# Patient Record
Sex: Male | Born: 1970 | Race: White | Hispanic: No | Marital: Married | Smoking: Never smoker
Health system: Southern US, Community
[De-identification: ages and names within clinical notes are randomized; demographics above are authoritative.]

## PROBLEM LIST (undated history)

## (undated) DIAGNOSIS — K219 Gastro-esophageal reflux disease without esophagitis: Secondary | ICD-10-CM

## (undated) DIAGNOSIS — M1990 Unspecified osteoarthritis, unspecified site: Secondary | ICD-10-CM

## (undated) DIAGNOSIS — T7840XA Allergy, unspecified, initial encounter: Secondary | ICD-10-CM

## (undated) DIAGNOSIS — I10 Essential (primary) hypertension: Secondary | ICD-10-CM

## (undated) HISTORY — DX: Gastro-esophageal reflux disease without esophagitis: K21.9

## (undated) HISTORY — DX: Essential (primary) hypertension: I10

## (undated) HISTORY — DX: Unspecified osteoarthritis, unspecified site: M19.90

## (undated) HISTORY — PX: KNEE SURGERY: SHX244

## (undated) HISTORY — PX: HERNIA REPAIR: SHX51

## (undated) HISTORY — DX: Allergy, unspecified, initial encounter: T78.40XA

---

## 2002-10-13 ENCOUNTER — Ambulatory Visit (HOSPITAL_BASED_OUTPATIENT_CLINIC_OR_DEPARTMENT_OTHER): Admission: RE | Admit: 2002-10-13 | Discharge: 2002-10-13 | Payer: Self-pay | Admitting: Orthopaedic Surgery

## 2007-01-17 ENCOUNTER — Ambulatory Visit (HOSPITAL_BASED_OUTPATIENT_CLINIC_OR_DEPARTMENT_OTHER): Admission: RE | Admit: 2007-01-17 | Discharge: 2007-01-17 | Payer: Self-pay | Admitting: Urology

## 2016-07-09 DIAGNOSIS — Z0000 Encounter for general adult medical examination without abnormal findings: Secondary | ICD-10-CM | POA: Diagnosis not present

## 2016-07-09 DIAGNOSIS — Z6827 Body mass index (BMI) 27.0-27.9, adult: Secondary | ICD-10-CM | POA: Diagnosis not present

## 2016-07-09 DIAGNOSIS — Z1389 Encounter for screening for other disorder: Secondary | ICD-10-CM | POA: Diagnosis not present

## 2016-07-09 LAB — CBC AND DIFFERENTIAL
HCT: 41 (ref 41–53)
HEMATOCRIT: 42 (ref 41–53)
HEMOGLOBIN: 14.1 (ref 13.5–17.5)
Hemoglobin: 14.8 (ref 13.5–17.5)
NEUTROS ABS: 5
NEUTROS ABS: 5
PLATELETS: 284 (ref 150–399)
Platelets: 356 (ref 150–399)
WBC: 7
WBC: 7

## 2016-07-09 LAB — LIPID PANEL
CHOLESTEROL: 192 (ref 0–200)
Cholesterol: 214 — AB (ref 0–200)
HDL: 60 (ref 35–70)
HDL: 62 (ref 35–70)
LDL CALC: 117
LDL CALC: 138
TRIGLYCERIDES: 77 (ref 40–160)
Triglycerides: 68 (ref 40–160)

## 2016-07-09 LAB — BASIC METABOLIC PANEL
BUN: 14 (ref 4–21)
BUN: 14 (ref 4–21)
CREATININE: 1 (ref 0.6–1.3)
Creatinine: 1 (ref 0.6–1.3)
GLUCOSE: 82
Glucose: 95
Potassium: 4.3 (ref 3.4–5.3)
Potassium: 4.4 (ref 3.4–5.3)
SODIUM: 141 (ref 137–147)
Sodium: 140 (ref 137–147)

## 2016-07-09 LAB — HEPATIC FUNCTION PANEL
ALK PHOS: 56 (ref 25–125)
ALT: 20 (ref 10–40)
ALT: 22 (ref 10–40)
AST: 18 (ref 14–40)
AST: 24 (ref 14–40)
Alkaline Phosphatase: 68 (ref 25–125)
Bilirubin, Total: 0.5
Bilirubin, Total: 0.8

## 2016-07-09 LAB — TSH
TSH: 1.26 (ref 0.41–5.90)
TSH: 1.43 (ref 0.41–5.90)

## 2017-07-16 DIAGNOSIS — Z1389 Encounter for screening for other disorder: Secondary | ICD-10-CM | POA: Diagnosis not present

## 2017-07-16 DIAGNOSIS — R030 Elevated blood-pressure reading, without diagnosis of hypertension: Secondary | ICD-10-CM | POA: Diagnosis not present

## 2017-07-16 DIAGNOSIS — Z6827 Body mass index (BMI) 27.0-27.9, adult: Secondary | ICD-10-CM | POA: Diagnosis not present

## 2017-07-16 DIAGNOSIS — Z0000 Encounter for general adult medical examination without abnormal findings: Secondary | ICD-10-CM | POA: Diagnosis not present

## 2017-07-16 LAB — BASIC METABOLIC PANEL
BUN: 12 (ref 4–21)
Creatinine: 1 (ref 0.6–1.3)
Glucose: 92
Potassium: 4.6 (ref 3.4–5.3)
Sodium: 141 (ref 137–147)

## 2017-07-16 LAB — HEPATIC FUNCTION PANEL
ALK PHOS: 58 (ref 25–125)
ALT: 24 (ref 10–40)
AST: 21 (ref 14–40)
Bilirubin, Total: 0.6

## 2017-07-16 LAB — CBC AND DIFFERENTIAL
HEMATOCRIT: 43 (ref 41–53)
HEMOGLOBIN: 14.4 (ref 13.5–17.5)
Neutrophils Absolute: 4
PLATELETS: 321 (ref 150–399)
WBC: 5.7

## 2017-07-16 LAB — LIPID PANEL
CHOLESTEROL: 173 (ref 0–200)
HDL: 62 (ref 35–70)
LDL Cholesterol: 99
TRIGLYCERIDES: 61 (ref 40–160)

## 2017-07-16 LAB — TSH: TSH: 1.55 (ref 0.41–5.90)

## 2018-03-10 ENCOUNTER — Ambulatory Visit: Payer: Self-pay | Admitting: Family Medicine

## 2018-03-25 DIAGNOSIS — L308 Other specified dermatitis: Secondary | ICD-10-CM | POA: Diagnosis not present

## 2018-03-25 DIAGNOSIS — L731 Pseudofolliculitis barbae: Secondary | ICD-10-CM | POA: Diagnosis not present

## 2018-03-25 DIAGNOSIS — C44311 Basal cell carcinoma of skin of nose: Secondary | ICD-10-CM | POA: Diagnosis not present

## 2018-03-25 DIAGNOSIS — L0100 Impetigo, unspecified: Secondary | ICD-10-CM | POA: Diagnosis not present

## 2018-03-25 DIAGNOSIS — L0889 Other specified local infections of the skin and subcutaneous tissue: Secondary | ICD-10-CM | POA: Diagnosis not present

## 2018-04-24 ENCOUNTER — Ambulatory Visit: Payer: BLUE CROSS/BLUE SHIELD | Admitting: Family Medicine

## 2018-04-24 ENCOUNTER — Encounter: Payer: Self-pay | Admitting: Family Medicine

## 2018-04-24 VITALS — BP 138/90 | HR 86 | Ht 69.5 in | Wt 204.0 lb

## 2018-04-24 DIAGNOSIS — E663 Overweight: Secondary | ICD-10-CM | POA: Diagnosis not present

## 2018-04-24 DIAGNOSIS — R030 Elevated blood-pressure reading, without diagnosis of hypertension: Secondary | ICD-10-CM | POA: Diagnosis not present

## 2018-04-24 DIAGNOSIS — Z8249 Family history of ischemic heart disease and other diseases of the circulatory system: Secondary | ICD-10-CM | POA: Diagnosis not present

## 2018-04-24 DIAGNOSIS — I10 Essential (primary) hypertension: Secondary | ICD-10-CM | POA: Insufficient documentation

## 2018-04-24 NOTE — Patient Instructions (Signed)

## 2018-04-24 NOTE — Progress Notes (Signed)
New patient office visit note:  Impression and Recommendations:    1. Elevated blood pressure reading without diagnosis of hypertension   2. (BMI 25.0-29.9)   3. FHx: early coronary artery disease-   father early 6650s first heart attack     1. Elevated BP reading without diagnosis -will continue to monitor.  2. BMI 25-29 -recommend losing weight.  -AHA dietary and exercise guidelines discussed. -encouraged to eat regular, small meals and snacks throughout the day.  3. FHx early CAD -will monitor closely.   -check fasting labs in near future.    Education and routine counseling performed. Handouts provided.  Gross side effects, risk and benefits, and alternatives of medications discussed with patient.  Patient is aware that all medications have potential side effects and we are unable to predict every side effect or drug-drug interaction that may occur.  Expresses verbal understanding and consents to current therapy plan and treatment regimen.  Return for CPE/ yrly physical, come fasting-when you are due over the next several months.;  And as needed.  Please see AVS handed out to patient at the end of our visit for further patient instructions/ counseling done pertaining to today's office visit.    Note: This document was prepared using Dragon voice recognition software and may include unintentional dictation errors.  This document serves as a record of services personally performed by Randall Loteborah Krystopher Kuenzel, DO. It was created on her behalf by Randall White, a trained medical scribe. The creation of this record is based on the scribe's personal observations and the provider's statements to them.   I have reviewed the above medical documentation for accuracy and completeness and I concur.  Randall White 04/27/18 10:11 PM   ----------------------------------------------------------------------------------------------------------------------    Subjective:    Chief  complaint:   Chief Complaint  Patient presents with  . Establish Care     HPI: Randall White is a pleasant 47 y.o. male who [facility] today to review their medical history with me and establish care.   I asked the patient to review their chronic problem list with me to ensure everything was updated and accurate.    All recent office visits with other providers, any medical records that patient brought in etc  - I reviewed today.     We asked pt to get us their medical records from [facility] providers/ specialists that they had seen within the past 3-5 years- if they are in private practice and/or do not work for [facility], [facility], [facility], [facility] or [facility] owned practice.  Told them to call their specialists to clarify this if they are not sure.   Personal information Married to wife, Randall LaundrySonya. 1 son.  He has 1 dog, 2 horses, 1 donkey.  1 estranged sister- unknown health history.  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 He has worked for the same company, [employer] for 25 years.   Little to no diet/exercise- too busy from working often. Feeling decreased energy recently. Drinks diet coke. Does not eat lunch on occasion due to work schedule.   Diet consists of little vegetables- does not like salads/tomatoes, etc.   Wants to lose some weight and regain some energy.   Other providers [place] in [place] [facility] - he only saw for CPE yearly.    PMHx History reviewed. No pertinent past medical history. None to his knowledge.  Seasonal allergies- takes  zyrtec  Takes advil (800 mg per week) for aches in back.  FMHx Family History  Problem Relation Age of Onset  . Heart attack Father   . Leukemia Maternal Grandmother   . Diabetes Paternal Grandmother   Maternal grandmother- early 18s leukemia diagnosis. Father - MI age 57  PSHx Past Surgical History:  Procedure Laterality Date  . HERNIA REPAIR      . KNEE SURGERY    L knee surgery Inguinal hernia repair  SHx No smoke Uses snuff: 1 can per day, 30 years. Goes to dentist.  Drinks 3-4 beers on some nights. No heavy binges.  Only monogamous with his wife.  Drinks 1-2 cups of coffee a day and a lot of water throughout the day.    Social History   Tobacco Use  . Smoking status: Never Smoker  . Smokeless tobacco: Current User    Types: Snuff  Substance Use Topics  . Alcohol use: Yes  . Drug use: Never      Wt Readings from Last 3 Encounters:  04/24/18 204 lb (92.5 kg)   BP Readings from Last 3 Encounters:  04/24/18 138/90   Pulse Readings from Last 3 Encounters:  04/24/18 86   BMI Readings from Last 3 Encounters:  04/24/18 29.69 kg/m    Patient Care Team    Relationship Specialty Notifications Start End  Randall Lot, DO PCP - General Family Medicine  04/24/18   Valeria Batman, MD Consulting Physician Orthopedic Surgery  04/24/18   Arminda Resides, MD Consulting Physician Dermatology  04/24/18   Albin Felling, OD  Optometry  04/24/18     Patient Active Problem List   Diagnosis Date Noted  . Overweight (BMI 25.0-29.9) 04/24/2018  . FHx: early coronary artery disease-   father early 59s first heart attack 04/24/2018  . Elevated blood pressure reading without diagnosis of hypertension 04/24/2018     History reviewed. No pertinent past medical history.   History reviewed. No pertinent past medical history.   Past Surgical History:  Procedure Laterality Date  . HERNIA REPAIR    . KNEE SURGERY       Family History  Problem Relation Age of Onset  . Heart attack Father   . Leukemia Maternal Grandmother   . Diabetes Paternal Grandmother      Social History   Substance and Sexual Activity  Drug Use Never     Social History   Substance and Sexual Activity  Alcohol Use Yes     Social History   Tobacco Use  Smoking Status Never Smoker  Smokeless Tobacco Current User  . Types: Snuff      Current Meds  Medication Sig  . cetirizine (ZYRTEC) 10 MG tablet Take 10 mg by mouth daily.  Marland Kitchen ibuprofen (ADVIL,MOTRIN) 200 MG tablet Take 200 mg by mouth every 6 (six) hours as needed.    Allergies: Patient has no allergy information on record.   Review of Systems  Constitutional: Negative for chills, diaphoresis, fever, malaise/fatigue and weight loss.  HENT: Negative for congestion, sore throat and tinnitus.   Eyes: Negative for blurred vision, double vision and photophobia.  Respiratory: Negative for cough and wheezing.   Cardiovascular: Negative for chest pain and palpitations.  Gastrointestinal: Negative for blood in stool, diarrhea, nausea and vomiting.  Genitourinary: Negative for dysuria, frequency and urgency.  Musculoskeletal: Negative for joint pain and myalgias.  Skin: Negative for itching and rash.  Neurological: Negative for dizziness, focal weakness, weakness and headaches.  Endo/Heme/Allergies:  Negative for environmental allergies and polydipsia. Does not bruise/bleed easily.  Psychiatric/Behavioral: Negative for depression and memory loss. The patient is not nervous/anxious and does not have insomnia.      Objective:   Blood pressure 138/90, pulse 86, height 5' 9.5" (1.765 m), weight 204 lb (92.5 kg), SpO2 98 %. Body mass index is 29.69 kg/m. General: Well Developed, well nourished, and in no acute distress.  Neuro: Alert and oriented x3, extra-ocular muscles intact, sensation grossly intact.  HEENT:[place]/AT, PERRLA, neck supple, No carotid bruits Skin: no gross rashes  Cardiac: Regular rate and rhythm Respiratory: Essentially clear to auscultation bilaterally. Not using accessory muscles, speaking in full sentences.  Abdominal: not grossly distended Musculoskeletal: Ambulates w/o diff, FROM * 4 ext.  Vasc: less 2 sec cap RF, warm and pink  Psych:  No HI/SI, judgement and insight good, Euthymic mood. Full Affect.    No results found for this or any  previous visit (from the past 2160 hour(s)).

## 2018-05-01 DIAGNOSIS — C44311 Basal cell carcinoma of skin of nose: Secondary | ICD-10-CM | POA: Diagnosis not present

## 2018-07-01 ENCOUNTER — Telehealth: Payer: Self-pay | Admitting: Family Medicine

## 2018-07-01 ENCOUNTER — Other Ambulatory Visit: Payer: Self-pay

## 2018-07-01 DIAGNOSIS — E663 Overweight: Secondary | ICD-10-CM

## 2018-07-01 DIAGNOSIS — Z8249 Family history of ischemic heart disease and other diseases of the circulatory system: Secondary | ICD-10-CM

## 2018-07-01 DIAGNOSIS — R030 Elevated blood-pressure reading, without diagnosis of hypertension: Secondary | ICD-10-CM

## 2018-07-01 NOTE — Telephone Encounter (Signed)
Orders for labs have been placed for patient. MPulliam, CMA/RT(R)

## 2018-07-01 NOTE — Telephone Encounter (Signed)
Patient's wife called states he needed these (2) thing for his employer ins discount --FBW & CPE--- just wanted medical assistant to know there are no lab order ( For scheduled / upcoming  Lab appt on 8/22.)  --Forwarding message to medical assistant.  --Fausto Skillernglh

## 2018-07-10 ENCOUNTER — Other Ambulatory Visit: Payer: BLUE CROSS/BLUE SHIELD

## 2018-07-10 ENCOUNTER — Encounter: Payer: Self-pay | Admitting: Family Medicine

## 2018-07-10 ENCOUNTER — Ambulatory Visit (INDEPENDENT_AMBULATORY_CARE_PROVIDER_SITE_OTHER): Payer: BLUE CROSS/BLUE SHIELD | Admitting: Family Medicine

## 2018-07-10 VITALS — BP 150/99 | HR 98 | Ht 70.0 in | Wt 199.8 lb

## 2018-07-10 DIAGNOSIS — Z114 Encounter for screening for human immunodeficiency virus [HIV]: Secondary | ICD-10-CM

## 2018-07-10 DIAGNOSIS — Z1211 Encounter for screening for malignant neoplasm of colon: Secondary | ICD-10-CM | POA: Diagnosis not present

## 2018-07-10 DIAGNOSIS — R030 Elevated blood-pressure reading, without diagnosis of hypertension: Secondary | ICD-10-CM

## 2018-07-10 DIAGNOSIS — Z0000 Encounter for general adult medical examination without abnormal findings: Secondary | ICD-10-CM

## 2018-07-10 DIAGNOSIS — K644 Residual hemorrhoidal skin tags: Secondary | ICD-10-CM

## 2018-07-10 DIAGNOSIS — Z1159 Encounter for screening for other viral diseases: Secondary | ICD-10-CM

## 2018-07-10 DIAGNOSIS — Z719 Counseling, unspecified: Secondary | ICD-10-CM

## 2018-07-10 DIAGNOSIS — Z8249 Family history of ischemic heart disease and other diseases of the circulatory system: Secondary | ICD-10-CM | POA: Diagnosis not present

## 2018-07-10 DIAGNOSIS — E663 Overweight: Secondary | ICD-10-CM | POA: Diagnosis not present

## 2018-07-10 LAB — POC HEMOCCULT BLD/STL (OFFICE/1-CARD/DIAGNOSTIC): Fecal Occult Blood, POC: POSITIVE — AB

## 2018-07-10 NOTE — Progress Notes (Signed)
Male physical  Impression and Recommendations:    1. Encounter for wellness examination   2. Encounter for screening for HIV   3. Encounter for hepatitis C screening test for low risk patient   4. Encounter for screening fecal occult blood testing   5. Health education/counseling   6. External hemorrhoids without complication (HOB Positive      1) Anticipatory Guidance: Discussed importance of wearing a seatbelt while driving, not texting while driving;   sunscreen when outside along with skin surveillance; eating a balanced and modest diet; physical activity at least 25 minutes per day or 150 min/ week moderate to intense activity.  2) Immunizations / Screenings / Labs: All immunizations are up-to-date per recommendations or will be updated today. Patient is due for dental and vision screens which pt will schedule independently. Will obtain CBC, CMP, HgA1c, Lipid panel, TSH and vit D when fasting, if not already done recently.   3) Weight:   BMI meaning discussed with patient.  Discussed goal of losing 5-10% of current body weight which would improve overall feelings of well being and improve objective health data. Improve nutrient density of diet through increasing intake of fruits and vegetables and decreasing saturated fats, white flour products and refined sugars.   4) BMI Counseling Explained to patient what BMI refers to, and what it means medically.    Told patient to think about it as a "medical risk stratification measurement" and how increasing BMI is associated with increasing risk/ or worsening state of various diseases such as hypertension, hyperlipidemia, diabetes, premature OA, depression etc.  American Heart Association guidelines for healthy diet, basically Mediterranean diet, and exercise guidelines of 30 minutes 5 days per week or more discussed in detail.  Health counseling performed.  All questions answered.  5) Lifestyle & Preventative Health  Maintenance - Advised patient to continue working toward exercising to improve overall mental, physical, and emotional health.    - Encouraged patient to engage in daily physical activity, especially a formal exercise routine.  Strongly encouraged to begin cardiovascular activity.  Recommended that the patient eventually strive for at least 150 minutes of moderate cardiovascular activity per week according to guidelines established by the [facility].   - Healthy dietary habits encouraged, including low-carb, and high amounts of lean protein in diet.   - Patient should also consume adequate amounts of water - half of body weight in oz of water per day.  6) Follow-Up - Encouraged patient to obtain a home blood pressure monitor to begin checking blood pressure at home, and keeping a blood pressure log to review next OV.  Patient understands that goal blood pressure is 120/80 or less.  - Return for regularly scheduled chronic follow up.  - Otherwise, patient knows to return to clinic for acute concerns and other consultations as needed.  Orders Placed This Encounter  Procedures  . Hepatitis C antibody  . HIV antibody  . POC Hemoccult Bld/Stl (1-Cd Office Dx)    Gross side effects, risk and benefits, and alternatives of medications discussed with patient.  Patient is aware that all medications have potential side effects and we are unable to predict every side effect or drug-drug interaction that may occur.  Expresses verbal understanding and consents to current therapy plan and treatment regimen.  Please see AVS handed out to patient at the end of our visit for further patient instructions/ counseling done pertaining to today's office visit.  Follow-up preventative CPE in 1 year. Follow-up  office visit pending lab work.  F/up sooner for chronic care management and/or prn  This document serves as a record of services personally performed by [name], DO. It was created on her behalf by  Peggye FothergillKatherine Galloway, a trained medical scribe. The creation of this record is based on the scribe's personal observations and the provider's statements to them.   I have reviewed the above medical documentation for accuracy and completeness and I concur.  [name] 07/14/18 10:17 PM     Subjective:    CC: CPE  HPI: Randall White is a 47 y.o. male who [facility] today for a yearly health maintenance exam.    Health Maintenance Summary Reviewed and updated, unless pt declines services.  Colonoscopy:   Unnecessary secondary to < 957 years old. Tobacco History Reviewed:  Yes, never smoker. CT scan for screening lung CA:  Never smoker. Abdominal Ultrasound:   Unnecessary secondary to < 48 years old. Alcohol:    No concerns, no excessive use Exercise Habits:  Not exercising outside of work; not meeting AHA guidelines. STD concerns:   None; no new sexual partners, still monogamous. Drug Use:   None Birth control method:  No concerns. Testicular/penile concerns:  Denies sexual health concerns.  Denies family history of early colon cancer.  Confirms family history of early CAD.  Family history of skin cancer in patient's father.  Patient notes he just had a place cut out of his nose 2.5 months ago, basal cell carcinoma, removed with Dr. Arminda Residesaniel Jones.  Returns to dermatology Tuesday of next week.  Patient wears prescription glasses.  He obtains dilated eye exams yearly.  Obtains dental cleanings every six months.  Denies SOB, chest pain, SOB, visual changes, heat intolerance, cold intolerance, heart palpitations.  Denies constipation, diarrhea, food intolerances, or other GI concerns.   Immunization History  Administered Date(s) Administered  . Tdap 03/08/2015    Health Maintenance  Topic Date Due  . INFLUENZA VACCINE  06/19/2018  . HIV Screening  04/25/2019 (Originally 10/24/1986)  . TETANUS/TDAP  03/07/2025      Wt  Readings from Last 3 Encounters:  07/10/18 199 lb 12.8 oz (90.6 kg)  04/24/18 204 lb (92.5 kg)   BP Readings from Last 3 Encounters:  07/10/18 (!) 150/99  04/24/18 138/90   Pulse Readings from Last 3 Encounters:  07/10/18 98  04/24/18 86    Patient Active Problem List   Diagnosis Date Noted  . External hemorrhoids without complication (HOB Positive 07/14/2018  . Health education/counseling 07/14/2018  . Overweight (BMI 25.0-29.9) 04/24/2018  . FHx: early coronary artery disease-   father early 5650s first heart attack 04/24/2018  . Elevated blood pressure reading without diagnosis of hypertension 04/24/2018    No past medical history on file.  Past Surgical History:  Procedure Laterality Date  . HERNIA REPAIR    . KNEE SURGERY      Family History  Problem Relation Age of Onset  . Heart attack Father   . Leukemia Maternal Grandmother   . Diabetes Paternal Grandmother     Social History   Substance and Sexual Activity  Drug Use Never  ,  Social History   Substance and Sexual Activity  Alcohol Use Yes  ,  Social History   Tobacco Use  Smoking Status Never Smoker  Smokeless Tobacco Current User  . Types: Snuff  ,  Social History   Substance and Sexual Activity  Sexual Activity Yes  .  Partners: Female    Patient's Medications  New Prescriptions   No medications on file  Previous Medications   CETIRIZINE (ZYRTEC) 10 MG TABLET    Take 10 mg by mouth daily.   IBUPROFEN (ADVIL,MOTRIN) 200 MG TABLET    Take 200 mg by mouth every 6 (six) hours as needed.  Modified Medications   No medications on file  Discontinued Medications   No medications on file    Patient has no known allergies.  Review of Systems: General:   Denies fever, chills, unexplained weight loss.  Optho/Auditory:   Denies visual changes, blurred vision/LOV Respiratory:   Denies SOB, DOE more than baseline levels.  Cardiovascular:   Denies chest pain, palpitations, new onset peripheral  edema  Gastrointestinal:   Denies nausea, vomiting, diarrhea.  Genitourinary: Denies dysuria, freq/ urgency, flank pain or discharge from genitals.  Endocrine:     Denies hot or cold intolerance, polyuria, polydipsia. Musculoskeletal:   Denies unexplained myalgias, joint swelling, unexplained arthralgias, gait problems.  Skin:  Denies rash, suspicious lesions Neurological:     Denies dizziness, unexplained weakness, numbness  Psychiatric/Behavioral:   Denies mood changes, suicidal or homicidal ideations, hallucinations    Objective:     Blood pressure (!) 150/99, pulse 98, height 5\' 10"  (1.778 m), weight 199 lb 12.8 oz (90.6 kg), SpO2 100 %. Body mass index is 28.67 kg/m. General Appearance:    Alert, cooperative, no distress, appears stated age  Head:    Normocephalic, without obvious abnormality, atraumatic  Eyes:    PERRL, conjunctiva/corneas clear, EOM's intact, fundi    benign, both eyes  Ears:    Normal TM's and external ear canals, both ears  Nose:   Nares normal, septum midline, mucosa normal, no drainage    or sinus tenderness  Throat:   Lips w/o lesion, mucosa moist, and tongue normal; teeth and   gums normal  Neck:   Supple, symmetrical, trachea midline, no adenopathy;    thyroid:  no enlargement/tenderness/nodules; no carotid   bruit or JVD  Back:     Symmetric, no curvature, ROM normal, no CVA tenderness  Lungs:     Clear to auscultation bilaterally, respirations unlabored, no       Wh/ R/ R  Chest Wall:    No tenderness or gross deformity; normal excursion   Heart:    Regular rate and rhythm, S1 and S2 normal, no murmur, rub   or gallop  Abdomen:     Soft, non-tender, bowel sounds active all four quadrants, NO   G/R/R, no masses, no organomegaly  Genitalia:   Ext genitalia: without lesion, no penile rash or discharge.  Superior aspect of right testicle near epididymis slightly enlarged, and slight enlargement on inguinal hernia exam on the right.  Patient with known  hernia repair and enlargement there.  Rectal:   Normal tone, prostate WNL's and equal b/l, no tenderness; 4 small external hemorrhoids, no frank bleeding, but slight positive hemoccult blood.  Non tender.  Extremities:   Extremities normal, atraumatic, no cyanosis or gross edema  Pulses:   2+ and symmetric all extremities  Skin:   Warm, dry, Skin color, texture, turgor normal, no obvious rashes or lesions  M-Sk:   Ambulates * 4 w/o difficulty, no gross deformities, tone WNL  Neurologic:   CNII-XII intact, normal strength, sensation and reflexes    Throughout Psych:  No HI/SI, judgement and insight good, Euthymic mood. Full Affect.

## 2018-07-10 NOTE — Patient Instructions (Addendum)
Please bring in your BP cuff readings from home in near future so we can review them when you come in for follow-up in the near future to discuss the lab work that was drawn today.  -Remember to use stool softeners to prevent any flare of your hemorrhoids as well.   Please realize, EXERCISE IS MEDICINE!  -  American Heart Association [facility]) guidelines for exercise : If you are in good health, without any medical conditions, you should engage in 150 minutes of moderate intensity aerobic activity per week.  This means you should be huffing and puffing throughout your workout.   Engaging in regular exercise will improve brain function and memory, as well as improve mood, boost immune system and help with weight management.  As well as the other, more well-known effects of exercise such as decreasing blood sugar levels, decreasing blood pressure,  and decreasing bad cholesterol levels/ increasing good cholesterol levels.     -  The AHA strongly endorses consumption of a diet that contains a variety of foods from all the food categories with an emphasis on fruits and vegetables; fat-free and low-fat dairy products; cereal and grain products; legumes and nuts; and fish, poultry, and/or extra lean meats.    Excessive food intake, especially of foods high in saturated and trans fats, sugar, and salt, should be avoided.    Adequate water intake of roughly 1/2 of your weight in pounds, should equal the ounces of water per day you should drink.  So for instance, if you're 200 pounds, that would be 100 ounces of water per day.         Mediterranean Diet  Why follow it? Research shows. . Those who follow the Mediterranean diet have a reduced risk of heart disease  . The diet is associated with a reduced incidence of Parkinson's and Alzheimer's diseases . People following the diet may have longer life expectancies and lower rates of chronic diseases  . The Dietary Guidelines for Americans recommends the  Mediterranean diet as an eating plan to promote health and prevent disease  What Is the Mediterranean Diet?  . Healthy eating plan based on typical foods and recipes of Mediterranean-style cooking . The diet is primarily a plant based diet; these foods should make up a majority of meals   Starches - Plant based foods should make up a majority of meals - They are an important sources of vitamins, minerals, energy, antioxidants, and fiber - Choose whole grains, foods high in fiber and minimally processed items  - Typical grain sources include wheat, oats, barley, corn, brown rice, bulgar, farro, millet, polenta, couscous  - Various types of beans include chickpeas, lentils, fava beans, black beans, white beans   Fruits  Veggies - Large quantities of antioxidant rich fruits & veggies; 6 or more servings  - Vegetables can be eaten raw or lightly drizzled with oil and cooked  - Vegetables common to the traditional Mediterranean Diet include: artichokes, arugula, beets, broccoli, brussel sprouts, cabbage, carrots, celery, collard greens, cucumbers, eggplant, kale, leeks, lemons, lettuce, mushrooms, okra, onions, peas, peppers, potatoes, pumpkin, radishes, rutabaga, shallots, spinach, sweet potatoes, turnips, zucchini - Fruits common to the Mediterranean Diet include: apples, apricots, avocados, cherries, clementines, dates, figs, grapefruits, grapes, melons, nectarines, oranges, peaches, pears, pomegranates, strawberries, tangerines  Fats - Replace butter and margarine with healthy oils, such as olive oil, canola oil, and tahini  - Limit nuts to no more than a handful a day  - Nuts include walnuts,  almonds, pecans, pistachios, pine nuts  - Limit or avoid candied, honey roasted or heavily salted nuts - Olives are central to the Mediterranean diet - can be eaten whole or used in a variety of dishes   Meats Protein - Limiting red meat: no more than a few times a month - When eating red meat: choose lean  cuts and keep the portion to the size of deck of cards - Eggs: approx. 0 to 4 times a week  - Fish and lean poultry: at least 2 a week  - Healthy protein sources include, chicken, [place], lean beef, lamb - Increase intake of seafood such as tuna, salmon, trout, mackerel, shrimp, scallops - Avoid or limit high fat processed meats such as sausage and bacon  Dairy - Include moderate amounts of low fat dairy products  - Focus on healthy dairy such as fat free yogurt, skim milk, low or reduced fat cheese - Limit dairy products higher in fat such as whole or 2% milk, cheese, ice cream  Alcohol - Moderate amounts of red wine is ok  - No more than 5 oz daily for women (all ages) and men older than age 65  - No more than 10 oz of wine daily for men younger than 29  Other - Limit sweets and other desserts  - Use herbs and spices instead of salt to flavor foods  - Herbs and spices common to the traditional Mediterranean Diet include: basil, bay leaves, chives, cloves, cumin, fennel, garlic, lavender, marjoram, mint, oregano, parsley, pepper, rosemary, sage, savory, sumac, tarragon, thyme   It's not just a diet, it's a lifestyle:  . The Mediterranean diet includes lifestyle factors typical of those in the region  . Foods, drinks and meals are best eaten with others and savored . Daily physical activity is important for overall good health . This could be strenuous exercise like running and aerobics . This could also be more leisurely activities such as walking, housework, yard-work, or taking the stairs . Moderation is the key; a balanced and healthy diet accommodates most foods and drinks . Consider portion sizes and frequency of consumption of certain foods   Meal Ideas & Options:  . Breakfast:  o Whole wheat toast or whole wheat English muffins with peanut butter & hard boiled egg o Steel cut oats topped with apples & cinnamon and skim milk  o Fresh fruit: banana, strawberries, melon, berries,  peaches  o Smoothies: strawberries, bananas, greek yogurt, peanut butter o Low fat greek yogurt with blueberries and granola  o Egg white omelet with spinach and mushrooms o Breakfast couscous: whole wheat couscous, apricots, skim milk, cranberries  . Sandwiches:  o Hummus and grilled vegetables (peppers, zucchini, squash) on whole wheat bread   o Grilled chicken on whole wheat pita with lettuce, tomatoes, cucumbers or tzatziki  o Tuna salad on whole wheat bread: tuna salad made with greek yogurt, olives, red peppers, capers, green onions o Garlic rosemary lamb pita: lamb sauted with garlic, rosemary, salt & pepper; add lettuce, cucumber, greek yogurt to pita - flavor with lemon juice and black pepper  . Seafood:  o Mediterranean grilled salmon, seasoned with garlic, basil, parsley, lemon juice and black pepper o Shrimp, lemon, and spinach whole-grain pasta salad made with low fat greek yogurt  o Seared scallops with lemon orzo  o Seared tuna steaks seasoned salt, pepper, coriander topped with tomato mixture of olives, tomatoes, olive oil, minced garlic, parsley, green onions and cappers  .  Meats:  o Herbed greek chicken salad with kalamata olives, cucumber, feta  o Red bell peppers stuffed with spinach, bulgur, lean ground beef (or lentils) & topped with feta   o Kebabs: skewers of chicken, tomatoes, onions, zucchini, squash  o [place] burgers: made with red onions, mint, dill, lemon juice, feta cheese topped with roasted red peppers . Vegetarian o Cucumber salad: cucumbers, artichoke hearts, celery, red onion, feta cheese, tossed in olive oil & lemon juice  o Hummus and whole grain pita points with a greek salad (lettuce, tomato, feta, olives, cucumbers, red onion) o Lentil soup with celery, carrots made with vegetable broth, garlic, salt and pepper  o Tabouli salad: parsley, bulgur, mint, scallions, cucumbers, tomato, radishes, lemon juice, olive oil, salt and pepper.     Preventive  Care for Adults, Male A healthy lifestyle and preventive care can promote health and wellness. Preventive health guidelines for men include the following key practices:  A routine yearly physical is a good way to check with your health care provider about your health and preventative screening. It is a chance to share any concerns and updates on your health and to receive a thorough exam.  Visit your dentist for a routine exam and preventative care every 6 months. Brush your teeth twice a day and floss once a day. Good oral hygiene prevents tooth decay and gum disease.  The frequency of eye exams is based on your age, health, family medical history, use of contact lenses, and other factors. Follow your health care provider's recommendations for frequency of eye exams.  Eat a healthy diet. Foods such as vegetables, fruits, whole grains, low-fat dairy products, and lean protein foods contain the nutrients you need without too many calories. Decrease your intake of foods high in solid fats, added sugars, and salt. Eat the right amount of calories for you.Get information about a proper diet from your health care provider, if necessary.  Regular physical exercise is one of the most important things you can do for your health. Most adults should get at least 150 minutes of moderate-intensity exercise (any activity that increases your heart rate and causes you to sweat) each week. In addition, most adults need muscle-strengthening exercises on 2 or more days a week.  Maintain a healthy weight. The body mass index (BMI) is a screening tool to identify possible weight problems. It provides an estimate of body fat based on height and weight. Your health care provider can find your BMI and can help you achieve or maintain a healthy weight.For adults 20 years and older:  A BMI below 18.5 is considered underweight.  A BMI of 18.5 to 24.9 is normal.  A BMI of 25 to 29.9 is considered overweight.  A BMI of  30 and above is considered obese.  Maintain normal blood lipids and cholesterol levels by exercising and minimizing your intake of saturated fat. Eat a balanced diet with plenty of fruit and vegetables. Blood tests for lipids and cholesterol should begin at age 71 and be repeated every 5 years. If your lipid or cholesterol levels are high, you are over 50, or you are at high risk for heart disease, you may need your cholesterol levels checked more frequently.Ongoing high lipid and cholesterol levels should be treated with medicines if diet and exercise are not working.  If you smoke, find out from your health care provider how to quit. If you do not use tobacco, do not start.  Lung cancer screening is recommended for  adults aged 72-80 years who are at high risk for developing lung cancer because of a history of smoking. A yearly low-dose CT scan of the lungs is recommended for people who have at least a 30-pack-year history of smoking and are a current smoker or have quit within the past 15 years. A pack year of smoking is smoking an average of 1 pack of cigarettes a day for 1 year (for example: 1 pack a day for 30 years or 2 packs a day for 15 years). Yearly screening should continue until the smoker has stopped smoking for at least 15 years. Yearly screening should be stopped for people who develop a health problem that would prevent them from having lung cancer treatment.  If you choose to drink alcohol, do not have more than 2 drinks per day. One drink is considered to be 12 ounces (355 mL) of beer, 5 ounces (148 mL) of wine, or 1.5 ounces (44 mL) of liquor.  Avoid use of street drugs. Do not share needles with anyone. Ask for help if you need support or instructions about stopping the use of drugs.  High blood pressure causes heart disease and increases the risk of stroke. Your blood pressure should be checked at least every 1-2 years. Ongoing high blood pressure should be treated with medicines,  if weight loss and exercise are not effective.  If you are 57-17 years old, ask your health care provider if you should take aspirin to prevent heart disease.  Diabetes screening is done by taking a blood sample to check your blood glucose level after you have not eaten for a certain period of time (fasting). If you are not overweight and you do not have risk factors for diabetes, you should be screened once every 3 years starting at age 48. If you are overweight or obese and you are 16-45 years of age, you should be screened for diabetes every year as part of your cardiovascular risk assessment.  Colorectal cancer can be detected and often prevented. Most routine colorectal cancer screening begins at the age of 70 and continues through age 24. However, your health care provider may recommend screening at an earlier age if you have risk factors for colon cancer. On a yearly basis, your health care provider may provide home test kits to check for hidden blood in the stool. Use of a small camera at the end of a tube to directly examine the colon (sigmoidoscopy or colonoscopy) can detect the earliest forms of colorectal cancer. Talk to your health care provider about this at age 39, when routine screening begins. Direct exam of the colon should be repeated every 5-10 years through age 75, unless early forms of precancerous polyps or small growths are found.  People who are at an increased risk for hepatitis B should be screened for this virus. You are considered at high risk for hepatitis B if:  You were born in a country where hepatitis B occurs often. Talk with your health care provider about which countries are considered high risk.  Your parents were born in a high-risk country and you have not received a shot to protect against hepatitis B (hepatitis B vaccine).  You have HIV or AIDS.  You use needles to inject street drugs.  You live with, or have sex with, someone who has hepatitis B.  You  are a man who has sex with other men (MSM).  You get hemodialysis treatment.  You take certain medicines for conditions such  as cancer, organ transplantation, and autoimmune conditions.  Hepatitis C blood testing is recommended for all people born from [birth year] through 1965 and any individual with known risks for hepatitis C.  Practice safe sex. Use condoms and avoid high-risk sexual practices to reduce the spread of sexually transmitted infections (STIs). STIs include gonorrhea, chlamydia, syphilis, trichomonas, herpes, HPV, and human immunodeficiency virus (HIV). Herpes, HIV, and HPV are viral illnesses that have no cure. They can result in disability, cancer, and death.  If you are a man who has sex with other men, you should be screened at least once per year for:  HIV.  Urethral, rectal, and pharyngeal infection of gonorrhea, chlamydia, or both.  If you are at risk of being infected with HIV, it is recommended that you take a prescription medicine daily to prevent HIV infection. This is called preexposure prophylaxis (PrEP). You are considered at risk if:  You are a man who has sex with other men (MSM) and have other risk factors.  You are a heterosexual man, are sexually active, and are at increased risk for HIV infection.  You take drugs by injection.  You are sexually active with a partner who has HIV.  Talk with your health care provider about whether you are at high risk of being infected with HIV. If you choose to begin PrEP, you should first be tested for HIV. You should then be tested every 3 months for as long as you are taking PrEP.  A one-time screening for abdominal aortic aneurysm (AAA) and surgical repair of large AAAs by ultrasound are recommended for men ages 53 to 2 years who are current or former smokers.  Healthy men should no longer receive prostate-specific antigen (PSA) blood tests as part of routine cancer screening. Talk with your health care provider about  prostate cancer screening.  Testicular cancer screening is not recommended for adult males who have no symptoms. Screening includes self-exam, a health care provider exam, and other screening tests. Consult with your health care provider about any symptoms you have or any concerns you have about testicular cancer.  Use sunscreen. Apply sunscreen liberally and repeatedly throughout the day. You should seek shade when your shadow is shorter than you. Protect yourself by wearing long sleeves, pants, a wide-brimmed hat, and sunglasses year round, whenever you are outdoors.  Once a month, do a whole-body skin exam, using a mirror to look at the skin on your back. Tell your health care provider about new moles, moles that have irregular borders, moles that are larger than a pencil eraser, or moles that have changed in shape or color.  Stay current with required vaccines (immunizations).  Influenza vaccine. All adults should be immunized every year.  Tetanus, diphtheria, and acellular pertussis (Td, Tdap) vaccine. An adult who has not previously received Tdap or who does not know his vaccine status should receive 1 dose of Tdap. This initial dose should be followed by tetanus and diphtheria toxoids (Td) booster doses every 10 years. Adults with an unknown or incomplete history of completing a 3-dose immunization series with Td-containing vaccines should begin or complete a primary immunization series including a Tdap dose. Adults should receive a Td booster every 10 years.  Varicella vaccine. An adult without evidence of immunity to varicella should receive 2 doses or a second dose if he has previously received 1 dose.  Human papillomavirus (HPV) vaccine. Males aged 11-21 years who have not received the vaccine previously should receive the 3-dose series. Males  aged 22-26 years may be immunized. Immunization is recommended through the age of 33 years for any male who has sex with males and did not get any  or all doses earlier. Immunization is recommended for any person with an immunocompromised condition through the age of 32 years if he did not get any or all doses earlier. During the 3-dose series, the second dose should be obtained 4-8 weeks after the first dose. The third dose should be obtained 24 weeks after the first dose and 16 weeks after the second dose.  Zoster vaccine. One dose is recommended for adults aged 76 years or older unless certain conditions are present.  Measles, mumps, and rubella (MMR) vaccine. Adults born before 21 generally are considered immune to measles and mumps. Adults born in [birth year] or later should have 1 or more doses of MMR vaccine unless there is a contraindication to the vaccine or there is laboratory evidence of immunity to each of the three diseases. A routine second dose of MMR vaccine should be obtained at least 28 days after the first dose for students attending postsecondary schools, health care workers, or international travelers. People who received inactivated measles vaccine or an unknown type of measles vaccine during 1963-1967 should receive 2 doses of MMR vaccine. People who received inactivated mumps vaccine or an unknown type of mumps vaccine before 1979 and are at high risk for mumps infection should consider immunization with 2 doses of MMR vaccine. Unvaccinated health care workers born before 2 who lack laboratory evidence of measles, mumps, or rubella immunity or laboratory confirmation of disease should consider measles and mumps immunization with 2 doses of MMR vaccine or rubella immunization with 1 dose of MMR vaccine.  Pneumococcal 13-valent conjugate (PCV13) vaccine. When indicated, a person who is uncertain of his immunization history and has no record of immunization should receive the PCV13 vaccine. All adults 45 years of age and older should receive this vaccine. An adult aged 33 years or older who has certain medical conditions and has not  been previously immunized should receive 1 dose of PCV13 vaccine. This PCV13 should be followed with a dose of pneumococcal polysaccharide (PPSV23) vaccine. Adults who are at high risk for pneumococcal disease should obtain the PPSV23 vaccine at least 8 weeks after the dose of PCV13 vaccine. Adults older than 48 years of age who have normal immune system function should obtain the PPSV23 vaccine dose at least 1 year after the dose of PCV13 vaccine.  Pneumococcal polysaccharide (PPSV23) vaccine. When PCV13 is also indicated, PCV13 should be obtained first. All adults aged 71 years and older should be immunized. An adult younger than age 96 years who has certain medical conditions should be immunized. Any person who resides in a nursing home or long-term care facility should be immunized. An adult smoker should be immunized. People with an immunocompromised condition and certain other conditions should receive both PCV13 and PPSV23 vaccines. People with human immunodeficiency virus (HIV) infection should be immunized as soon as possible after diagnosis. Immunization during chemotherapy or radiation therapy should be avoided. Routine use of PPSV23 vaccine is not recommended for American Indians, [place] Natives, or people younger than 65 years unless there are medical conditions that require PPSV23 vaccine. When indicated, people who have unknown immunization and have no record of immunization should receive PPSV23 vaccine. One-time revaccination 5 years after the first dose of PPSV23 is recommended for people aged 19-64 years who have chronic kidney failure, nephrotic syndrome, asplenia, or immunocompromised  conditions. People who received 1-2 doses of PPSV23 before age 56 years should receive another dose of PPSV23 vaccine at age 23 years or later if at least 5 years have passed since the previous dose. Doses of PPSV23 are not needed for people immunized with PPSV23 at or after age 30 years.  Meningococcal  vaccine. Adults with asplenia or persistent complement component deficiencies should receive 2 doses of quadrivalent meningococcal conjugate (MenACWY-D) vaccine. The doses should be obtained at least 2 months apart. Microbiologists working with certain meningococcal bacteria, [place] recruits, people at risk during an outbreak, and people who travel to or live in countries with a high rate of meningitis should be immunized. A first-year college student up through age 7 years who is living in a residence hall should receive a dose if he did not receive a dose on or after his 16th birthday. Adults who have certain high-risk conditions should receive one or more doses of vaccine.  Hepatitis A vaccine. Adults who wish to be protected from this disease, have chronic liver disease, work with hepatitis A-infected animals, work in hepatitis A research labs, or travel to or work in countries with a high rate of hepatitis A should be immunized. Adults who were previously unvaccinated and who anticipate close contact with an international adoptee during the first 60 days after arrival in the [place] from a country with a high rate of hepatitis A should be immunized.  Hepatitis B vaccine. Adults should be immunized if they wish to be protected from this disease, are under age 16 years and have diabetes, have chronic liver disease, have had more than one sex partner in the past 6 months, may be exposed to blood or other infectious body fluids, are household contacts or sex partners of hepatitis B positive people, are clients or workers in certain care facilities, or travel to or work in countries with a high rate of hepatitis B.  Haemophilus influenzae type b (Hib) vaccine. A previously unvaccinated person with asplenia or sickle cell disease or having a scheduled splenectomy should receive 1 dose of Hib vaccine. Regardless of previous immunization, a recipient of a hematopoietic stem cell transplant should receive  a 3-dose series 6-12 months after his successful transplant. Hib vaccine is not recommended for adults with HIV infection. Preventive Service / Frequency Ages 51 to 56  Blood pressure check.** / Every 3-5 years.  Lipid and cholesterol check.** / Every 5 years beginning at age 70.  Hepatitis C blood test.** / For any individual with known risks for hepatitis C.  Skin self-exam. / Monthly.  Influenza vaccine. / Every year.  Tetanus, diphtheria, and acellular pertussis (Tdap, Td) vaccine.** / Consult your health care provider. 1 dose of Td every 10 years.  Varicella vaccine.** / Consult your health care provider.  HPV vaccine. / 3 doses over 6 months, if 50 or younger.  Measles, mumps, rubella (MMR) vaccine.** / You need at least 1 dose of MMR if you were born in 1958 or later. You may also need a second dose.  Pneumococcal 13-valent conjugate (PCV13) vaccine.** / Consult your health care provider.  Pneumococcal polysaccharide (PPSV23) vaccine.** / 1 to 2 doses if you smoke cigarettes or if you have certain conditions.  Meningococcal vaccine.** / 1 dose if you are age 33 to 59 years and a Market researcher living in a residence hall, or have one of several medical conditions. You may also need additional booster doses.  Hepatitis A vaccine.** / Consult your  health care provider.  Hepatitis B vaccine.** / Consult your health care provider.  Haemophilus influenzae type b (Hib) vaccine.** / Consult your health care provider. Ages 7 to 58  Blood pressure check.** / Every year.  Lipid and cholesterol check.** / Every 5 years beginning at age 39.  Lung cancer screening. / Every year if you are aged 51-80 years and have a 30-pack-year history of smoking and currently smoke or have quit within the past 15 years. Yearly screening is stopped once you have quit smoking for at least 15 years or develop a health problem that would prevent you from having lung cancer  treatment.  Fecal occult blood test (FOBT) of stool. / Every year beginning at age 55 and continuing until age 71. You may not have to do this test if you get a colonoscopy every 10 years.  Flexible sigmoidoscopy** or colonoscopy.** / Every 5 years for a flexible sigmoidoscopy or every 10 years for a colonoscopy beginning at age 74 and continuing until age 7.  Hepatitis C blood test.** / For all people born from [birth year] through 1966 and any individual with known risks for hepatitis C.  Skin self-exam. / Monthly.  Influenza vaccine. / Every year.  Tetanus, diphtheria, and acellular pertussis (Tdap/Td) vaccine.** / Consult your health care provider. 1 dose of Td every 10 years.  Varicella vaccine.** / Consult your health care provider.  Zoster vaccine.** / 1 dose for adults aged 20 years or older.  Measles, mumps, rubella (MMR) vaccine.** / You need at least 1 dose of MMR if you were born in 1958 or later. You may also need a second dose.  Pneumococcal 13-valent conjugate (PCV13) vaccine.** / Consult your health care provider.  Pneumococcal polysaccharide (PPSV23) vaccine.** / 1 to 2 doses if you smoke cigarettes or if you have certain conditions.  Meningococcal vaccine.** / Consult your health care provider.  Hepatitis A vaccine.** / Consult your health care provider.  Hepatitis B vaccine.** / Consult your health care provider.  Haemophilus influenzae type b (Hib) vaccine.** / Consult your health care provider. Ages 70 and over  Blood pressure check.** / Every year.  Lipid and cholesterol check.**/ Every 5 years beginning at age 73.  Lung cancer screening. / Every year if you are aged 90-80 years and have a 30-pack-year history of smoking and currently smoke or have quit within the past 15 years. Yearly screening is stopped once you have quit smoking for at least 15 years or develop a health problem that would prevent you from having lung cancer treatment.  Fecal occult blood test  (FOBT) of stool. / Every year beginning at age 82 and continuing until age 82. You may not have to do this test if you get a colonoscopy every 10 years.  Flexible sigmoidoscopy** or colonoscopy.** / Every 5 years for a flexible sigmoidoscopy or every 10 years for a colonoscopy beginning at age 61 and continuing until age 93.  Hepatitis C blood test.** / For all people born from [birth year] through 1966 and any individual with known risks for hepatitis C.  Abdominal aortic aneurysm (AAA) screening.** / A one-time screening for ages 51 to 57 years who are current or former smokers.  Skin self-exam. / Monthly.  Influenza vaccine. / Every year.  Tetanus, diphtheria, and acellular pertussis (Tdap/Td) vaccine.** / 1 dose of Td every 10 years.  Varicella vaccine.** / Consult your health care provider.  Zoster vaccine.** / 1 dose for adults aged 74 years or older.  Pneumococcal  13-valent conjugate (PCV13) vaccine.** / 1 dose for all adults aged 61 years and older.  Pneumococcal polysaccharide (PPSV23) vaccine.** / 1 dose for all adults aged 28 years and older.  Meningococcal vaccine.** / Consult your health care provider.  Hepatitis A vaccine.** / Consult your health care provider.  Hepatitis B vaccine.** / Consult your health care provider.  Haemophilus influenzae type b (Hib) vaccine.** / Consult your health care provider. **Family history and personal history of risk and conditions may change your health care provider's recommendations.   This information is not intended to replace advice given to you by your health care provider. Make sure you discuss any questions you have with your health care provider.   Document Released: 01/01/2002 Document Revised: 11/26/2014 Document Reviewed: 04/02/2011 Elsevier Interactive Patient Education [employer].

## 2018-07-11 LAB — CBC WITH DIFFERENTIAL/PLATELET
BASOS: 1 %
Basophils Absolute: 0 10*3/uL (ref 0.0–0.2)
EOS (ABSOLUTE): 0.1 10*3/uL (ref 0.0–0.4)
EOS: 2 %
HEMATOCRIT: 41.9 % (ref 37.5–51.0)
Hemoglobin: 14 g/dL (ref 13.0–17.7)
IMMATURE GRANULOCYTES: 0 %
Immature Grans (Abs): 0 10*3/uL (ref 0.0–0.1)
Lymphocytes Absolute: 0.9 10*3/uL (ref 0.7–3.1)
Lymphs: 15 %
MCH: 31.1 pg (ref 26.6–33.0)
MCHC: 33.4 g/dL (ref 31.5–35.7)
MCV: 93 fL (ref 79–97)
MONOS ABS: 0.6 10*3/uL (ref 0.1–0.9)
Monocytes: 10 %
NEUTROS ABS: 4.1 10*3/uL (ref 1.4–7.0)
NEUTROS PCT: 72 %
Platelets: 288 10*3/uL (ref 150–450)
RBC: 4.5 x10E6/uL (ref 4.14–5.80)
RDW: 13.7 % (ref 12.3–15.4)
WBC: 5.7 10*3/uL (ref 3.4–10.8)

## 2018-07-11 LAB — LIPID PANEL
Chol/HDL Ratio: 3.7 ratio (ref 0.0–5.0)
Cholesterol, Total: 179 mg/dL (ref 100–199)
HDL: 48 mg/dL (ref >39)
LDL Calculated: 122 mg/dL — ABNORMAL HIGH (ref 0–99)
Triglycerides: 43 mg/dL (ref 0–149)
VLDL CHOLESTEROL CAL: 9 mg/dL (ref 5–40)

## 2018-07-11 LAB — HEMOGLOBIN A1C
Est. average glucose Bld gHb Est-mCnc: 105 mg/dL
Hgb A1c MFr Bld: 5.3 % (ref 4.8–5.6)

## 2018-07-11 LAB — TSH: TSH: 1.68 u[IU]/mL (ref 0.450–4.500)

## 2018-07-11 LAB — COMPREHENSIVE METABOLIC PANEL
A/G RATIO: 2.3 — AB (ref 1.2–2.2)
ALBUMIN: 5 g/dL (ref 3.5–5.5)
ALT: 24 IU/L (ref 0–44)
AST: 23 IU/L (ref 0–40)
Alkaline Phosphatase: 64 IU/L (ref 39–117)
BUN / CREAT RATIO: 11 (ref 9–20)
BUN: 12 mg/dL (ref 6–24)
Bilirubin Total: 0.5 mg/dL (ref 0.0–1.2)
CALCIUM: 10.2 mg/dL (ref 8.7–10.2)
CO2: 25 mmol/L (ref 20–29)
CREATININE: 1.1 mg/dL (ref 0.76–1.27)
Chloride: 103 mmol/L (ref 96–106)
GFR calc Af Amer: 92 mL/min/{1.73_m2} (ref >59)
GFR, EST NON AFRICAN AMERICAN: 80 mL/min/{1.73_m2} (ref >59)
GLOBULIN, TOTAL: 2.2 g/dL (ref 1.5–4.5)
Glucose: 103 mg/dL — ABNORMAL HIGH (ref 65–99)
POTASSIUM: 5 mmol/L (ref 3.5–5.2)
SODIUM: 142 mmol/L (ref 134–144)
TOTAL PROTEIN: 7.2 g/dL (ref 6.0–8.5)

## 2018-07-11 LAB — T4, FREE: Free T4: 1.35 ng/dL (ref 0.82–1.77)

## 2018-07-11 LAB — VITAMIN D 25 HYDROXY (VIT D DEFICIENCY, FRACTURES): VIT D 25 HYDROXY: 49.1 ng/mL (ref 30.0–100.0)

## 2018-07-14 DIAGNOSIS — K644 Residual hemorrhoidal skin tags: Secondary | ICD-10-CM | POA: Insufficient documentation

## 2018-07-14 DIAGNOSIS — Z719 Counseling, unspecified: Secondary | ICD-10-CM | POA: Insufficient documentation

## 2018-07-17 DIAGNOSIS — L301 Dyshidrosis [pompholyx]: Secondary | ICD-10-CM | POA: Diagnosis not present

## 2018-07-17 DIAGNOSIS — Z85828 Personal history of other malignant neoplasm of skin: Secondary | ICD-10-CM | POA: Diagnosis not present

## 2018-08-15 ENCOUNTER — Ambulatory Visit: Payer: BLUE CROSS/BLUE SHIELD | Admitting: Family Medicine

## 2019-07-07 DIAGNOSIS — L301 Dyshidrosis [pompholyx]: Secondary | ICD-10-CM | POA: Diagnosis not present

## 2019-08-06 DIAGNOSIS — L301 Dyshidrosis [pompholyx]: Secondary | ICD-10-CM | POA: Diagnosis not present

## 2019-08-27 DIAGNOSIS — L301 Dyshidrosis [pompholyx]: Secondary | ICD-10-CM | POA: Diagnosis not present

## 2019-08-27 DIAGNOSIS — L239 Allergic contact dermatitis, unspecified cause: Secondary | ICD-10-CM | POA: Diagnosis not present

## 2020-01-06 DIAGNOSIS — G43009 Migraine without aura, not intractable, without status migrainosus: Secondary | ICD-10-CM | POA: Diagnosis not present

## 2020-01-06 DIAGNOSIS — J3089 Other allergic rhinitis: Secondary | ICD-10-CM | POA: Diagnosis not present

## 2020-03-23 DIAGNOSIS — G43009 Migraine without aura, not intractable, without status migrainosus: Secondary | ICD-10-CM | POA: Diagnosis not present

## 2020-05-23 DIAGNOSIS — L03011 Cellulitis of right finger: Secondary | ICD-10-CM | POA: Diagnosis not present

## 2020-05-26 DIAGNOSIS — R030 Elevated blood-pressure reading, without diagnosis of hypertension: Secondary | ICD-10-CM | POA: Diagnosis not present

## 2020-05-26 DIAGNOSIS — L03011 Cellulitis of right finger: Secondary | ICD-10-CM | POA: Diagnosis not present

## 2020-07-04 ENCOUNTER — Other Ambulatory Visit: Payer: Self-pay | Admitting: Physician Assistant

## 2020-07-04 DIAGNOSIS — Z0000 Encounter for general adult medical examination without abnormal findings: Secondary | ICD-10-CM

## 2020-07-05 ENCOUNTER — Other Ambulatory Visit: Payer: Self-pay

## 2020-07-05 ENCOUNTER — Other Ambulatory Visit: Payer: BC Managed Care – PPO

## 2020-07-05 DIAGNOSIS — R030 Elevated blood-pressure reading, without diagnosis of hypertension: Secondary | ICD-10-CM | POA: Diagnosis not present

## 2020-07-05 DIAGNOSIS — R7309 Other abnormal glucose: Secondary | ICD-10-CM | POA: Diagnosis not present

## 2020-07-05 DIAGNOSIS — E785 Hyperlipidemia, unspecified: Secondary | ICD-10-CM | POA: Diagnosis not present

## 2020-07-05 DIAGNOSIS — Z0000 Encounter for general adult medical examination without abnormal findings: Secondary | ICD-10-CM

## 2020-07-06 LAB — LIPID PANEL
Chol/HDL Ratio: 4.7 ratio (ref 0.0–5.0)
Cholesterol, Total: 227 mg/dL — ABNORMAL HIGH (ref 100–199)
HDL: 48 mg/dL (ref >39)
LDL Chol Calc (NIH): 163 mg/dL — ABNORMAL HIGH (ref 0–99)
Triglycerides: 88 mg/dL (ref 0–149)
VLDL Cholesterol Cal: 16 mg/dL (ref 5–40)

## 2020-07-06 LAB — CBC
Hematocrit: 43 % (ref 37.5–51.0)
Hemoglobin: 14.3 g/dL (ref 13.0–17.7)
MCH: 31.4 pg (ref 26.6–33.0)
MCHC: 33.3 g/dL (ref 31.5–35.7)
MCV: 95 fL (ref 79–97)
Platelets: 297 10*3/uL (ref 150–450)
RBC: 4.55 x10E6/uL (ref 4.14–5.80)
RDW: 13.1 % (ref 11.6–15.4)
WBC: 5.2 10*3/uL (ref 3.4–10.8)

## 2020-07-06 LAB — COMPREHENSIVE METABOLIC PANEL
ALT: 36 IU/L (ref 0–44)
AST: 30 IU/L (ref 0–40)
Albumin/Globulin Ratio: 2.2 (ref 1.2–2.2)
Albumin: 5 g/dL (ref 4.0–5.0)
Alkaline Phosphatase: 78 IU/L (ref 48–121)
BUN/Creatinine Ratio: 10 (ref 9–20)
BUN: 12 mg/dL (ref 6–24)
Bilirubin Total: 0.5 mg/dL (ref 0.0–1.2)
CO2: 25 mmol/L (ref 20–29)
Calcium: 10.4 mg/dL — ABNORMAL HIGH (ref 8.7–10.2)
Chloride: 99 mmol/L (ref 96–106)
Creatinine, Ser: 1.15 mg/dL (ref 0.76–1.27)
GFR calc Af Amer: 86 mL/min/{1.73_m2} (ref >59)
GFR calc non Af Amer: 74 mL/min/{1.73_m2} (ref >59)
Globulin, Total: 2.3 g/dL (ref 1.5–4.5)
Glucose: 100 mg/dL — ABNORMAL HIGH (ref 65–99)
Potassium: 5.1 mmol/L (ref 3.5–5.2)
Sodium: 139 mmol/L (ref 134–144)
Total Protein: 7.3 g/dL (ref 6.0–8.5)

## 2020-07-06 LAB — TSH: TSH: 1.84 u[IU]/mL (ref 0.450–4.500)

## 2020-07-06 LAB — HEMOGLOBIN A1C
Est. average glucose Bld gHb Est-mCnc: 108 mg/dL
Hgb A1c MFr Bld: 5.4 % (ref 4.8–5.6)

## 2020-08-02 ENCOUNTER — Encounter: Payer: BLUE CROSS/BLUE SHIELD | Admitting: Physician Assistant

## 2020-09-01 DIAGNOSIS — K649 Unspecified hemorrhoids: Secondary | ICD-10-CM | POA: Diagnosis not present

## 2020-09-01 DIAGNOSIS — Z0000 Encounter for general adult medical examination without abnormal findings: Secondary | ICD-10-CM | POA: Diagnosis not present

## 2020-09-01 DIAGNOSIS — Z1211 Encounter for screening for malignant neoplasm of colon: Secondary | ICD-10-CM | POA: Diagnosis not present

## 2020-09-01 DIAGNOSIS — Z23 Encounter for immunization: Secondary | ICD-10-CM | POA: Diagnosis not present

## 2020-10-03 ENCOUNTER — Other Ambulatory Visit: Payer: Self-pay

## 2020-10-03 ENCOUNTER — Ambulatory Visit (INDEPENDENT_AMBULATORY_CARE_PROVIDER_SITE_OTHER): Payer: BC Managed Care – PPO | Admitting: Physician Assistant

## 2020-10-03 ENCOUNTER — Encounter: Payer: Self-pay | Admitting: Physician Assistant

## 2020-10-03 VITALS — BP 130/88 | HR 113 | Temp 98.2°F | Ht 68.75 in | Wt 203.9 lb

## 2020-10-03 DIAGNOSIS — K219 Gastro-esophageal reflux disease without esophagitis: Secondary | ICD-10-CM

## 2020-10-03 DIAGNOSIS — Z0000 Encounter for general adult medical examination without abnormal findings: Secondary | ICD-10-CM

## 2020-10-03 DIAGNOSIS — Z1159 Encounter for screening for other viral diseases: Secondary | ICD-10-CM

## 2020-10-03 DIAGNOSIS — Z114 Encounter for screening for human immunodeficiency virus [HIV]: Secondary | ICD-10-CM | POA: Diagnosis not present

## 2020-10-03 DIAGNOSIS — Z1211 Encounter for screening for malignant neoplasm of colon: Secondary | ICD-10-CM | POA: Diagnosis not present

## 2020-10-03 DIAGNOSIS — Z2821 Immunization not carried out because of patient refusal: Secondary | ICD-10-CM

## 2020-10-03 MED ORDER — PANTOPRAZOLE SODIUM 20 MG PO TBEC: 20.0000 mg | DELAYED_RELEASE_TABLET | Freq: Every day | ORAL | 1 refills | Status: DC

## 2020-10-03 NOTE — Patient Instructions (Addendum)
Preventive Care 41-50 Years Old, Male Preventive care refers to lifestyle choices and visits with your health care provider that can promote health and wellness. This includes:  A yearly physical exam. This is also called an annual well check.  Regular dental and eye exams.  Immunizations.  Screening for certain conditions.  Healthy lifestyle choices, such as eating a healthy diet, getting regular exercise, not using drugs or products that contain nicotine and tobacco, and limiting alcohol use. What can I expect for my preventive care visit? Physical exam Your health care provider will check:  Height and weight. These may be used to calculate body mass index (BMI), which is a measurement that tells if you are at a healthy weight.  Heart rate and blood pressure.  Your skin for abnormal spots. Counseling Your health care provider may ask you questions about:  Alcohol, tobacco, and drug use.  Emotional well-being.  Home and relationship well-being.  Sexual activity.  Eating habits.  Work and work Statistician. What immunizations do I need?  Influenza (flu) vaccine  This is recommended every year. Tetanus, diphtheria, and pertussis (Tdap) vaccine  You may need a Td booster every 10 years. Varicella (chickenpox) vaccine  You may need this vaccine if you have not already been vaccinated. Zoster (shingles) vaccine  You may need this after age 64. Measles, mumps, and rubella (MMR) vaccine  You may need at least one dose of MMR if you were born in 1958 or later. You may also need a second dose. Pneumococcal conjugate (PCV13) vaccine  You may need this if you have certain conditions and were not previously vaccinated. Pneumococcal polysaccharide (PPSV23) vaccine  You may need one or two doses if you smoke cigarettes or if you have certain conditions. Meningococcal conjugate (MenACWY) vaccine  You may need this if you have certain conditions. Hepatitis A  vaccine  You may need this if you have certain conditions or if you travel or work in places where you may be exposed to hepatitis A. Hepatitis B vaccine  You may need this if you have certain conditions or if you travel or work in places where you may be exposed to hepatitis B. Haemophilus influenzae type b (Hib) vaccine  You may need this if you have certain risk factors. Human papillomavirus (HPV) vaccine  If recommended by your health care provider, you may need three doses over 6 months. You may receive vaccines as individual doses or as more than one vaccine together in one shot (combination vaccines). Talk with your health care provider about the risks and benefits of combination vaccines. What tests do I need? Blood tests  Lipid and cholesterol levels. These may be checked every 5 years, or more frequently if you are over 60 years old.  Hepatitis C test.  Hepatitis B test. Screening  Lung cancer screening. You may have this screening every year starting at age 43 if you have a 30-pack-year history of smoking and currently smoke or have quit within the past 15 years.  Prostate cancer screening. Recommendations will vary depending on your family history and other risks.  Colorectal cancer screening. All adults should have this screening starting at age 72 and continuing until age 2. Your health care provider may recommend screening at age 14 if you are at increased risk. You will have tests every 1-10 years, depending on your results and the type of screening test.  Diabetes screening. This is done by checking your blood sugar (glucose) after you have not eaten  for a while (fasting). You may have this done every 1-3 years.  Sexually transmitted disease (STD) testing. Follow these instructions at home: Eating and drinking  Eat a diet that includes fresh fruits and vegetables, whole grains, lean protein, and low-fat dairy products.  Take vitamin and mineral supplements as  recommended by your health care provider.  Do not drink alcohol if your health care provider tells you not to drink.  If you drink alcohol: ? Limit how much you have to 0-2 drinks a day. ? Be aware of how much alcohol is in your drink. In the [place] one drink equals one 12 oz bottle of beer (355 mL), one 5 oz glass of wine (148 mL), or one 1 oz glass of hard liquor (44 mL). Lifestyle  Take daily care of your teeth and gums.  Stay active. Exercise for at least 30 minutes on 5 or more days each week.  Do not use any products that contain nicotine or tobacco, such as cigarettes, e-cigarettes, and chewing tobacco. If you need help quitting, ask your health care provider.  If you are sexually active, practice safe sex. Use a condom or other form of protection to prevent STIs (sexually transmitted infections).  Talk with your health care provider about taking a low-dose aspirin every day starting at age 14. What's next?  Go to your health care provider once a year for a well check visit.  Ask your health care provider how often you should have your eyes and teeth checked.  Stay up to date on all vaccines. This information is not intended to replace advice given to you by your health care provider. Make sure you discuss any questions you have with your health care provider. Document Revised: 10/30/2018 Document Reviewed: 10/30/2018 Elsevier Patient Education  [place].    Preventing Hypertension Hypertension, commonly called high blood pressure, is when the force of blood pumping through the arteries is too strong. Arteries are blood vessels that carry blood from the heart throughout the body. Over time, hypertension can damage the arteries and decrease blood flow to important parts of the body, including the brain, heart, and kidneys. Often, hypertension does not cause symptoms until blood pressure is very high. For this reason, it is important to have your blood pressure checked  on a regular basis. Hypertension can often be prevented with diet and lifestyle changes. If you already have hypertension, you can control it with diet and lifestyle changes, as well as medicine. What nutrition changes can be made? Maintain a healthy diet. This includes:  Eating less salt (sodium). Ask your health care provider how much sodium is safe for you to have. The general recommendation is to consume less than 1 tsp (2,300 mg) of sodium a day. ? Do not add salt to your food. ? Choose low-sodium options when grocery shopping and eating out.  Limiting fats in your diet. You can do this by eating low-fat or fat-free dairy products and by eating less red meat.  Eating more fruits, vegetables, and whole grains. Make a goal to eat: ? 1-2 cups of fresh fruits and vegetables each day. ? 3-4 servings of whole grains each day.  Avoiding foods and beverages that have added sugars.  Eating fish that contain healthy fats (omega-3 fatty acids), such as mackerel or salmon. If you need help putting together a healthy eating plan, try the DASH diet. This diet is high in fruits, vegetables, and whole grains. It is low in sodium, red  meat, and added sugars. DASH stands for Dietary Approaches to Stop Hypertension. What lifestyle changes can be made?   Lose weight if you are overweight. Losing just 3?5% of your body weight can help prevent or control hypertension. ? For example, if your present weight is 200 lb (91 kg), a loss of 3-5% of your weight means losing 6-10 lb (2.7-4.5 kg). ? Ask your health care provider to help you with a diet and exercise plan to safely lose weight.  Get enough exercise. Do at least 150 minutes of moderate-intensity exercise each week. ? You could do this in short exercise sessions several times a day, or you could do longer exercise sessions a few times a week. For example, you could take a brisk 10-minute walk or bike ride, 3 times a day, for 5 days a week.  Find ways  to reduce stress, such as exercising, meditating, listening to music, or taking a yoga class. If you need help reducing stress, ask your health care provider.  Do not smoke. This includes e-cigarettes. Chemicals in tobacco and nicotine products raise your blood pressure each time you smoke. If you need help quitting, ask your health care provider.  Avoid alcohol. If you drink alcohol, limit alcohol intake to no more than 1 drink a day for nonpregnant women and 2 drinks a day for men. One drink equals 12 oz of beer, 5 oz of wine, or 1 oz of hard liquor. Why are these changes important? Diet and lifestyle changes can help you prevent hypertension, and they may make you feel better overall and improve your quality of life. If you have hypertension, making these changes will help you control it and help prevent major complications, such as:  Hardening and narrowing of arteries that supply blood to: ? Your heart. This can cause a heart attack. ? Your brain. This can cause a stroke. ? Your kidneys. This can cause kidney failure.  Stress on your heart muscle, which can cause heart failure. What can I do to lower my risk?  Work with your health care provider to make a hypertension prevention plan that works for you. Follow your plan and keep all follow-up visits as told by your health care provider.  Learn how to check your blood pressure at home. Make sure that you know your personal target blood pressure, as told by your health care provider. How is this treated? In addition to diet and lifestyle changes, your health care provider may recommend medicines to help lower your blood pressure. You may need to try a few different medicines to find what works best for you. You also may need to take more than one medicine. Take over-the-counter and prescription medicines only as told by your health care provider. Where to find support Your health care provider can help you prevent hypertension and help you  keep your blood pressure at a healthy level. Your local hospital or your community may also provide support services and prevention programs. The American Heart Association offers an online support network at: https://www.lee.net/ Where to find more information Learn more about hypertension from:  National Heart, Lung, and Blood Institute: https://www.peterson.org/  Centers for Disease Control and Prevention: AboutHD.co.nz  American Academy of Family Physicians: http://familydoctor.org/familydoctor/en/diseases-conditions/high-blood-pressure.printerview.all.html Learn more about the DASH diet from:  National Heart, Lung, and Blood Institute: WedMap.it Contact a health care provider if:  You think you are having a reaction to medicines you have taken.  You have recurrent headaches or feel dizzy.  You have  swelling in your ankles.  You have trouble with your vision. Summary  Hypertension often does not cause any symptoms until blood pressure is very high. It is important to get your blood pressure checked regularly.  Diet and lifestyle changes are the most important steps in preventing hypertension.  By keeping your blood pressure in a healthy range, you can prevent complications like heart attack, heart failure, stroke, and kidney failure.  Work with your health care provider to make a hypertension prevention plan that works for you. This information is not intended to replace advice given to you by your health care provider. Make sure you discuss any questions you have with your health care provider. Document Revised: 02/27/2019 Document Reviewed: 07/16/2016 Elsevier Patient Education  2020 [employer].

## 2020-10-03 NOTE — Progress Notes (Signed)
Male physical   Impression and Recommendations:    1. Healthcare maintenance   2. Screening for colon cancer   3. Screening for HIV (human immunodeficiency virus)   4. Need for hepatitis C screening test   5. Gastroesophageal reflux disease, unspecified whether esophagitis present   6. Influenza vaccination declined      1) Anticipatory Guidance: Discussed skin CA prevention and sunscreen when outside along with skin surveillance; eating a balanced and modest diet; physical activity at least 25 minutes per day or minimum of 150 min/ week moderate to intense activity.  2) Immunizations / Screenings / Labs:   All immunizations are up-to-date per recommendations or will be updated today if pt allows.    - Patient understands with dental and vision screens they will schedule independently.  - Obtained CBC, CMP, HgA1c, Lipid panel, and TSH when fasting. Most labs are essentially within normal limits or stable from prior with exception of lipid panel- total cholesterol and LDL elevated. The 10-year ASCVD risk score Denman George DC Jr., et al., 2013) is: 4% - Declined influenza vaccine. - Agreeable to Hep C and HIV screenings, and colonoscopy.   3) Weight:  Discussed goal to improve diet habits to improve overall feelings of well being and objective health data. Improve nutrient density of diet through increasing intake of fruits and vegetables and decreasing saturated fats, white flour products and refined sugars.   4) Healthcare Maintenance: -Will send rx for pantoprazole 20 mg for GERD symptoms.  -Recommend to follow a heart healthy diet and reduce saturated and trans fats such as fast food. Stay as active as possible. -Stay well hydrated. -Recommend to avoid smokeless tobacco. -Recommend to try different antihistamine such as Xyzal for allergies and continue with nasal lavage.  -Continue ambulatory BP monitoring at home. Notify the clinic if BP consistently >130/85. -Follow up in 1  year for CPE and FBW, lab visit in 6 months to repeat lipid panel and cmp (recheck calcium).   Orders Placed This Encounter  Procedures  . HIV Antibody (routine testing w rflx)  . Hepatitis C antibody  . Ambulatory referral to Gastroenterology    Referral Priority:   Routine    Referral Type:   Consultation    Referral Reason:   Specialty Services Required    Number of Visits Requested:   1    Meds ordered this encounter  Medications  . pantoprazole (PROTONIX) 20 MG tablet    Sig: Take 1 tablet (20 mg total) by mouth daily.    Dispense:  90 tablet    Refill:  1    Order Specific Question:   Supervising Provider    Answer:   Nani Gasser D [[id number]]     Return in about 1 year (around 10/03/2021) for CPE and FBW' lab visit in 6 months for lipid panel, cmp.  Reminded pt important of f-up preventative CPE in 1 year.  Reminded pt again, this is in addition to any chronic care visits.    Gross side effects, risk and benefits, and alternatives of medications discussed with patient.  Patient is aware that all medications have potential side effects and we are unable to predict every side effect or drug-drug interaction that may occur.  Expresses verbal understanding and consents to current therapy plan and treatment regimen.  Please see AVS handed out to patient at the end of our visit for further patient instructions/ counseling done pertaining to today's office visit.  Note:  This note was  prepared with assistance of Conservation officer, historic buildings. Occasional wrong-word or sound-a-like substitutions may have occurred due to the inherent limitations of voice recognition software.    Subjective:        CC: CPE   HPI: DORRANCE SELLICK is a 49 y.o. male who presents to [facility] today for a yearly health maintenance exam.     Health Maintenance Summary  - Reviewed and updated, unless pt declines services.  Last Cologuard or Colonoscopy:    Placed screening colonoscopy order. Family history of Colon CA: N  Tobacco History Reviewed:  Yes, never smoker, chews tobacco Alcohol / drug use:   no excessive use / no use Dental Home:  Y Eye exams: Y Male history: STD concerns:   none Additional penile/ urinary concerns: none   Additional concerns beyond Health Maintenance issues:   Heartburn symptoms. Has tried omeprazole with minimal success. Interested on trying different medication.    Immunization History  Administered Date(s) Administered  . Tdap 03/08/2015     Health Maintenance  Topic Date Due  . Hepatitis C Screening  Never done  . COVID-19 Vaccine (1) Never done  . INFLUENZA VACCINE  Never done  . TETANUS/TDAP  03/07/2025  . HIV Screening  Completed       Wt Readings from Last 3 Encounters:  10/03/20 203 lb 14.4 oz (92.5 kg)  07/10/18 199 lb 12.8 oz (90.6 kg)  04/24/18 204 lb (92.5 kg)   BP Readings from Last 3 Encounters:  10/03/20 130/88  07/10/18 (!) 150/99  04/24/18 138/90   Pulse Readings from Last 3 Encounters:  10/03/20 (!) 113  07/10/18 98  04/24/18 86    Patient Active Problem List   Diagnosis Date Noted  . External hemorrhoids without complication (HOB Positive 07/14/2018  . Health education/counseling 07/14/2018  . Overweight (BMI 25.0-29.9) 04/24/2018  . FHx: early coronary artery disease-   father early 71s first heart attack 04/24/2018  . Elevated blood pressure reading without diagnosis of hypertension 04/24/2018    History reviewed. No pertinent past medical history.  Past Surgical History:  Procedure Laterality Date  . HERNIA REPAIR    . KNEE SURGERY      Family History  Problem Relation Age of Onset  . Heart attack Father   . Leukemia Maternal Grandmother   . Diabetes Paternal Grandmother     Social History   Substance and Sexual Activity  Drug Use Never  ,  Social History   Substance and Sexual Activity  Alcohol Use Yes  ,  Social History   Tobacco  Use  Smoking Status Never Smoker  Smokeless Tobacco Current User  . Types: Snuff  ,  Social History   Substance and Sexual Activity  Sexual Activity Yes  . Partners: Female    Patient's Medications  New Prescriptions   PANTOPRAZOLE (PROTONIX) 20 MG TABLET    Take 1 tablet (20 mg total) by mouth daily.  Previous Medications   CETIRIZINE (ZYRTEC) 10 MG TABLET    Take 10 mg by mouth daily.   IBUPROFEN (ADVIL,MOTRIN) 200 MG TABLET    Take 200 mg by mouth every 6 (six) hours as needed.  Modified Medications   No medications on file  Discontinued Medications   No medications on file    Patient has no known allergies.  Review of Systems: General:   Denies fever, chills, unexplained weight loss.  Optho/Auditory:   Denies visual changes, blurred vision/LOV Respiratory:   Denies  SOB, DOE more than baseline levels.   Cardiovascular:   Denies chest pain, palpitations, new onset peripheral edema  Gastrointestinal:   Denies constipation, vomiting, diarrhea.  Genitourinary: Denies dysuria, freq/ urgency, flank pain Endocrine:     Denies hot or cold intolerance, polyuria, polydipsia. Musculoskeletal:   Denies unexplained myalgias, joint swelling, unexplained arthralgias, gait problems.  Skin:  Denies rash, suspicious lesions Neurological:     Denies dizziness, unexplained weakness, numbness  Psychiatric/Behavioral:   Denies mood changes, suicidal or homicidal ideations, hallucinations    Objective:     Blood pressure 130/88, pulse (!) 113, temperature 98.2 F (36.8 C), temperature source Oral, height 5' 8.75" (1.746 m), weight 203 lb 14.4 oz (92.5 kg), SpO2 100 %. Body mass index is 30.33 kg/m. General Appearance:    Alert, cooperative, no distress, appears stated age  Head:    Normocephalic, without obvious abnormality, atraumatic  Eyes:    PERRL, conjunctiva/corneas clear, EOM's intact, both eyes  Ears:    Normal external ear canals, both ears; bilateral fluid behind TM   Nose:    Nares normal, septum midline, mucosa normal, no drainage    or sinus tenderness  Throat:   Lips w/o lesion, mucosa moist, and tongue normal; teeth and gums normal  Neck:   Supple, symmetrical, trachea midline, no adenopathy;    thyroid:  no enlargement/tenderness/nodules; no JVD  Back:     Symmetric, no curvature, ROM normal, no CVA tenderness  Lungs:     Clear to auscultation bilaterally, respirations unlabored, no  Wh/ R/ R  Chest Wall:    No tenderness or gross deformity; normal excursion   Heart:    Regular rate and rhythm, S1 and S2 normal, no murmur, rub   or gallop  Abdomen:     Soft, non-tender, bowel sounds active all four quadrants, No G/R/R, no masses, no organomegaly  Genitalia:    Declined. No concerns/sxs.  Rectal:    Declined. No concerns/sxs.  Extremities:   Extremities normal, atraumatic, no cyanosis or gross edema  Pulses:   2+ and symmetric all extremities  Skin:   Warm, dry, Skin color, texture, turgor normal, no obvious rashes or lesions  M-Sk:   Ambulates * 4 w/o difficulty, no gross deformities, tone WNL  Neurologic:   CNII-XII grossly intact, normal strength, sensation and reflexes    Throughout Psych:  No HI/SI, judgement and insight good, Euthymic mood. Full Affect.

## 2020-10-04 LAB — HIV ANTIBODY (ROUTINE TESTING W REFLEX): HIV Screen 4th Generation wRfx: NONREACTIVE

## 2020-10-04 LAB — HEPATITIS C ANTIBODY: Hep C Virus Ab: <0.1 s/co ratio (ref 0.0–0.9)

## 2020-12-14 DIAGNOSIS — R030 Elevated blood-pressure reading, without diagnosis of hypertension: Secondary | ICD-10-CM | POA: Diagnosis not present

## 2020-12-14 DIAGNOSIS — S39012A Strain of muscle, fascia and tendon of lower back, initial encounter: Secondary | ICD-10-CM | POA: Diagnosis not present

## 2021-03-29 ENCOUNTER — Other Ambulatory Visit: Payer: Self-pay | Admitting: Physician Assistant

## 2021-03-29 DIAGNOSIS — K219 Gastro-esophageal reflux disease without esophagitis: Secondary | ICD-10-CM

## 2021-04-03 ENCOUNTER — Other Ambulatory Visit: Payer: Self-pay

## 2021-04-03 ENCOUNTER — Other Ambulatory Visit: Payer: BC Managed Care – PPO

## 2021-04-03 ENCOUNTER — Other Ambulatory Visit: Payer: Self-pay | Admitting: Physician Assistant

## 2021-04-03 DIAGNOSIS — E785 Hyperlipidemia, unspecified: Secondary | ICD-10-CM

## 2021-04-03 DIAGNOSIS — Z0000 Encounter for general adult medical examination without abnormal findings: Secondary | ICD-10-CM

## 2021-04-04 LAB — LIPID PANEL
Chol/HDL Ratio: 4.3 ratio (ref 0.0–5.0)
Cholesterol, Total: 213 mg/dL — ABNORMAL HIGH (ref 100–199)
HDL: 50 mg/dL (ref >39)
LDL Chol Calc (NIH): 145 mg/dL — ABNORMAL HIGH (ref 0–99)
Triglycerides: 102 mg/dL (ref 0–149)
VLDL Cholesterol Cal: 18 mg/dL (ref 5–40)

## 2021-04-04 LAB — COMPREHENSIVE METABOLIC PANEL
ALT: 40 IU/L (ref 0–44)
AST: 32 IU/L (ref 0–40)
Albumin/Globulin Ratio: 2.3 — ABNORMAL HIGH (ref 1.2–2.2)
Albumin: 4.8 g/dL (ref 4.0–5.0)
Alkaline Phosphatase: 60 IU/L (ref 44–121)
BUN/Creatinine Ratio: 12 (ref 9–20)
BUN: 15 mg/dL (ref 6–24)
Bilirubin Total: 0.5 mg/dL (ref 0.0–1.2)
CO2: 23 mmol/L (ref 20–29)
Calcium: 9.9 mg/dL (ref 8.7–10.2)
Chloride: 100 mmol/L (ref 96–106)
Creatinine, Ser: 1.29 mg/dL — ABNORMAL HIGH (ref 0.76–1.27)
Globulin, Total: 2.1 g/dL (ref 1.5–4.5)
Glucose: 103 mg/dL — ABNORMAL HIGH (ref 65–99)
Potassium: 5 mmol/L (ref 3.5–5.2)
Sodium: 137 mmol/L (ref 134–144)
Total Protein: 6.9 g/dL (ref 6.0–8.5)
eGFR: 68 mL/min/{1.73_m2} (ref >59)

## 2021-04-07 ENCOUNTER — Other Ambulatory Visit: Payer: Self-pay

## 2021-04-07 DIAGNOSIS — Z8249 Family history of ischemic heart disease and other diseases of the circulatory system: Secondary | ICD-10-CM

## 2021-04-07 DIAGNOSIS — E785 Hyperlipidemia, unspecified: Secondary | ICD-10-CM

## 2021-04-10 ENCOUNTER — Other Ambulatory Visit: Payer: Self-pay

## 2021-04-10 ENCOUNTER — Other Ambulatory Visit: Payer: BC Managed Care – PPO

## 2021-04-10 DIAGNOSIS — Z8249 Family history of ischemic heart disease and other diseases of the circulatory system: Secondary | ICD-10-CM

## 2021-04-10 DIAGNOSIS — E785 Hyperlipidemia, unspecified: Secondary | ICD-10-CM

## 2021-04-11 LAB — COMPREHENSIVE METABOLIC PANEL
ALT: 47 IU/L — ABNORMAL HIGH (ref 0–44)
AST: 42 IU/L — ABNORMAL HIGH (ref 0–40)
Albumin/Globulin Ratio: 2.1 (ref 1.2–2.2)
Albumin: 5.2 g/dL — ABNORMAL HIGH (ref 4.0–5.0)
Alkaline Phosphatase: 65 IU/L (ref 44–121)
BUN/Creatinine Ratio: 10 (ref 9–20)
BUN: 12 mg/dL (ref 6–24)
Bilirubin Total: 0.5 mg/dL (ref 0.0–1.2)
CO2: 23 mmol/L (ref 20–29)
Calcium: 10.2 mg/dL (ref 8.7–10.2)
Chloride: 101 mmol/L (ref 96–106)
Creatinine, Ser: 1.24 mg/dL (ref 0.76–1.27)
Globulin, Total: 2.5 g/dL (ref 1.5–4.5)
Glucose: 102 mg/dL — ABNORMAL HIGH (ref 65–99)
Potassium: 5 mmol/L (ref 3.5–5.2)
Sodium: 142 mmol/L (ref 134–144)
Total Protein: 7.7 g/dL (ref 6.0–8.5)
eGFR: 71 mL/min/{1.73_m2} (ref >59)

## 2021-05-10 ENCOUNTER — Ambulatory Visit: Payer: BC Managed Care – PPO | Admitting: Orthopaedic Surgery

## 2021-05-10 ENCOUNTER — Other Ambulatory Visit: Payer: Self-pay

## 2021-05-10 ENCOUNTER — Ambulatory Visit: Payer: Self-pay

## 2021-05-10 ENCOUNTER — Encounter: Payer: Self-pay | Admitting: Orthopaedic Surgery

## 2021-05-10 VITALS — Ht 69.0 in | Wt 207.0 lb

## 2021-05-10 DIAGNOSIS — G8929 Other chronic pain: Secondary | ICD-10-CM

## 2021-05-10 DIAGNOSIS — M545 Low back pain, unspecified: Secondary | ICD-10-CM | POA: Insufficient documentation

## 2021-05-10 DIAGNOSIS — M5442 Lumbago with sciatica, left side: Secondary | ICD-10-CM | POA: Diagnosis not present

## 2021-05-10 DIAGNOSIS — M79605 Pain in left leg: Secondary | ICD-10-CM

## 2021-05-10 DIAGNOSIS — M5441 Lumbago with sciatica, right side: Secondary | ICD-10-CM | POA: Diagnosis not present

## 2021-05-10 NOTE — Progress Notes (Signed)
Office Visit Note   Patient: Randall White           Date of Birth: [birth year]           MRN: [id number] Visit Date: 05/10/2021              Requested by: Mayer Masker, PA-C [address] [place],  [place] [address] PCP: Mayer Masker, PA-C   Assessment & Plan: Visit Diagnoses:  1. Pain in left leg   2. Chronic bilateral low back pain with bilateral sciatica     Plan: [name] has been experiencing low back pain "off-and-on" for at least 2 years.  Pain originates in the lumbar spine and radiates to both of his thighs with tingling.  He is having more trouble on the left than the right.  He denies bowel or bladder dysfunction.  He has not had any weakness.  He does take Advil it seems to make a difference.  Films demonstrate what might be a spondylolysis at L5-S1.  Given the chronicity of his problem I think it is worth obtaining an MRI scan so we can further delineate the pathology and then consider appropriate treatment  Follow-Up Instructions: Return After MRI scan lumbar spine.   Orders:  Orders Placed This Encounter  Procedures   XR Lumbar Spine 2-3 Views   No orders of the defined types were placed in this encounter.     Procedures: No procedures performed   Clinical Data: No additional findings.   Subjective: Chief Complaint  Patient presents with   Lower Back - Pain  Patient presents today for lower back pain. He said that it has been hurting on and off for a couple years, but has worsened. He said that he has pain that will travel down his left leg. He has some numbness in his left leg, even when his back is not hurting. His back pain gets worse with walking. He finds comfort in his recliner. He takes Ibuprofen.  HPI  Review of Systems   Objective: Vital Signs: Ht 5\' 9"  (1.753 m)   Wt 207 lb (93.9 kg)   BMI 30.57 kg/m   Physical Exam Constitutional:      Appearance: He is well-developed.  Eyes:     Pupils: Pupils are equal,  round, and reactive to light.  Pulmonary:     Effort: Pulmonary effort is normal.  Skin:    General: Skin is warm and dry.  Neurological:     Mental Status: He is alert and oriented to person, place, and time.  Psychiatric:        Behavior: Behavior normal.    Ortho Exam awake alert and oriented x3.  Comfortable sitting straight leg raise demonstrating some tightness of the hamstring but no back pain.  Reflexes were depressed but symmetrical.  Motor exam intact.  No percussible tenderness lumbar spine.  No tenderness about the buttock or either greater trochanter.  Painless range of motion both hips.  Motor and sensory exam intact  Specialty Comments:  No specialty comments available.  Imaging: XR Lumbar Spine 2-3 Views  Result Date: 05/10/2021 Films of the lumbar spine were obtained in 2 projections.  There is a very minimal i.e. less than 5 degrees curvature to the right.  No evidence of a listhesis but there might be a spondylolysis at L5-S1.  The spaces are well-maintained.  No acute fractures    PMFS History: Patient Active Problem List   Diagnosis Date Noted  Low back pain 05/10/2021   External hemorrhoids without complication (HOB Positive 07/14/2018   Health education/counseling 07/14/2018   Overweight (BMI 25.0-29.9) 04/24/2018   FHx: early coronary artery disease-   father early 32s first heart attack 04/24/2018   Elevated blood pressure reading without diagnosis of hypertension 04/24/2018   History reviewed. No pertinent past medical history.  Family History  Problem Relation Age of Onset   Heart attack Father    Leukemia Maternal Grandmother    Diabetes Paternal Grandmother     Past Surgical History:  Procedure Laterality Date   HERNIA REPAIR     KNEE SURGERY     Social History   Occupational History   Not on file  Tobacco Use   Smoking status: Never   Smokeless tobacco: Current    Types: Snuff  Vaping Use   Vaping Use: Never used  Substance and  Sexual Activity   Alcohol use: Yes   Drug use: Never   Sexual activity: Yes    Partners: Female

## 2021-06-05 ENCOUNTER — Other Ambulatory Visit: Payer: Self-pay

## 2021-06-05 ENCOUNTER — Ambulatory Visit
Admission: RE | Admit: 2021-06-05 | Discharge: 2021-06-05 | Disposition: A | Discharge status: Home / Self Care | Payer: BC Managed Care – PPO | Source: Ambulatory Visit | Attending: Orthopaedic Surgery | Admitting: Orthopaedic Surgery

## 2021-06-05 DIAGNOSIS — G8929 Other chronic pain: Secondary | ICD-10-CM

## 2021-06-05 DIAGNOSIS — M545 Low back pain, unspecified: Secondary | ICD-10-CM | POA: Diagnosis not present

## 2021-06-05 DIAGNOSIS — M48061 Spinal stenosis, lumbar region without neurogenic claudication: Secondary | ICD-10-CM | POA: Diagnosis not present

## 2021-06-05 DIAGNOSIS — M5442 Lumbago with sciatica, left side: Secondary | ICD-10-CM

## 2021-06-05 DIAGNOSIS — M79605 Pain in left leg: Secondary | ICD-10-CM

## 2021-06-15 ENCOUNTER — Encounter: Payer: Self-pay | Admitting: Orthopaedic Surgery

## 2021-06-15 ENCOUNTER — Ambulatory Visit: Payer: BC Managed Care – PPO | Admitting: Orthopaedic Surgery

## 2021-06-15 VITALS — Ht 69.0 in | Wt 207.0 lb

## 2021-06-15 DIAGNOSIS — M79605 Pain in left leg: Secondary | ICD-10-CM

## 2021-06-15 DIAGNOSIS — G8929 Other chronic pain: Secondary | ICD-10-CM

## 2021-06-15 DIAGNOSIS — M5442 Lumbago with sciatica, left side: Secondary | ICD-10-CM | POA: Diagnosis not present

## 2021-06-15 NOTE — Progress Notes (Signed)
Office Visit Note   Patient: Randall White           Date of Birth: [birth year]           MRN: [id number] Visit Date: 06/15/2021              Requested by: Randall Masker, PA-C [address] [place],  [place] [address] PCP: Randall Masker, PA-C   Assessment & Plan: Visit Diagnoses:  1. Pain in left leg   2. Chronic bilateral low back pain with left-sided sciatica     Plan: Randall White had an MRI scan of his lumbar spine and demonstrating mild left subarticular and foraminal narrowing at L5-S1 secondary to a leftward protruding disc and asymmetric left-sided facet hypertrophy.  He had mild foraminal narrowing bilaterally at L4-5 worse on the left and mild disc bulging and facet hypertrophy at L2-3 and L3-4 without stenosis.  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  He still having some trouble with his back as he stands for a length of time and then referred pain into his left lower extremity.  He has not noticed any weakness or bowel or bladder changes.  His exam was relatively benign and so I think it might be worthwhile to have Randall White evaluate him for an epidural steroid injection.  Follow-Up Instructions: Return in about 1 month (around 07/16/2021).   Orders:  Orders Placed This Encounter  Procedures   Ambulatory referral to Physical Medicine Rehab   No orders of the defined types were placed in this encounter.     Procedures: No procedures performed   Clinical Data: No additional findings.   Subjective: Chief Complaint  Patient presents with   Lower Back - Follow-up  Patient presents today for follow up on his lower. He had an MRI and is here today to discuss those results.  No change in symptoms  HPI  Review of Systems   Objective: Vital Signs: Ht 5\' 9"  (1.753 m)   Wt 207 lb (93.9 kg)   BMI 30.57 kg/m   Physical Exam Constitutional:      Appearance: He is well-developed.  Eyes:     Pupils: Pupils are equal,  round, and reactive to light.  Pulmonary:     Effort: Pulmonary effort is normal.  Skin:    General: Skin is warm and dry.  Neurological:     Mental Status: He is alert and oriented to person, place, and time.  Psychiatric:        Behavior: Behavior normal.    Ortho Exam awake and alert and oriented and comfortable.  Ambulates without a limp.  Straight leg raise negative bilaterally.  Motor and sensory exam intact.  No percussible tenderness of lumbar spine.  No pain with range of motion of either hip  Specialty Comments:  No specialty comments available.  Imaging: No results found.   PMFS History: Patient Active Problem List   Diagnosis Date Noted   Low back pain 05/10/2021   External hemorrhoids without complication (HOB Positive 07/14/2018   Health education/counseling 07/14/2018   Overweight (BMI 25.0-29.9) 04/24/2018   FHx: early coronary artery disease-   father early 79s first heart attack 04/24/2018   Elevated blood pressure reading without diagnosis of hypertension 04/24/2018   History reviewed. No pertinent past medical history.  Family History  Problem Relation Age of Onset   Heart attack Father    Leukemia Maternal Grandmother    Diabetes Paternal Grandmother  Past Surgical History:  Procedure Laterality Date   HERNIA REPAIR     KNEE SURGERY     Social History   Occupational History   Not on file  Tobacco Use   Smoking status: Never   Smokeless tobacco: Current    Types: Snuff  Vaping Use   Vaping Use: Never used  Substance and Sexual Activity   Alcohol use: Yes   Drug use: Never   Sexual activity: Yes    Partners: Female

## 2021-06-21 ENCOUNTER — Telehealth: Payer: Self-pay | Admitting: Physical Medicine and Rehabilitation

## 2021-06-21 NOTE — Telephone Encounter (Signed)
Pt's wife Randall White returned call from [facility]. Please call back on behalf of pt. Phone number is [phone].

## 2021-06-26 ENCOUNTER — Telehealth: Payer: Self-pay | Admitting: Physical Medicine and Rehabilitation

## 2021-06-26 NOTE — Telephone Encounter (Signed)
Pts wife Lissa Hoard called on the pts behlf stating he needs to R/S his 07/11/21 appt for another late afternoon.  [phone]

## 2021-06-26 NOTE — Telephone Encounter (Signed)
Appointment rescheduled.

## 2021-06-28 ENCOUNTER — Other Ambulatory Visit: Payer: Self-pay | Admitting: Physician Assistant

## 2021-06-28 DIAGNOSIS — K219 Gastro-esophageal reflux disease without esophagitis: Secondary | ICD-10-CM

## 2021-07-11 ENCOUNTER — Ambulatory Visit: Payer: BC Managed Care – PPO | Admitting: Physical Medicine and Rehabilitation

## 2021-07-17 ENCOUNTER — Ambulatory Visit: Payer: Self-pay

## 2021-07-17 ENCOUNTER — Encounter: Payer: Self-pay | Admitting: Physical Medicine and Rehabilitation

## 2021-07-17 ENCOUNTER — Ambulatory Visit (INDEPENDENT_AMBULATORY_CARE_PROVIDER_SITE_OTHER): Payer: BC Managed Care – PPO | Admitting: Physical Medicine and Rehabilitation

## 2021-07-17 ENCOUNTER — Other Ambulatory Visit: Payer: Self-pay

## 2021-07-17 VITALS — BP 142/93 | HR 108

## 2021-07-17 DIAGNOSIS — M5416 Radiculopathy, lumbar region: Secondary | ICD-10-CM

## 2021-07-17 MED ORDER — METHYLPREDNISOLONE ACETATE 80 MG/ML IJ SUSP
80.0000 mg | Freq: Once | INTRAMUSCULAR | Status: AC
  Administered 2021-07-17: 80 mg

## 2021-07-17 NOTE — Progress Notes (Signed)
Pt state lower back pain that travels down both leg mostly his left side. Pt state standing and bending over makes the pain worse. Pt state he takes over the counter pain meds to help ease his pain.   Numeric Pain Rating Scale and Functional Assessment Average Pain 4   In the last MONTH (on 0-10 scale) has pain interfered with the following?  1. General activity like being  able to carry out your everyday physical activities such as walking, climbing stairs, carrying groceries, or moving a chair?  Rating(8)   +Driver, -BT, -Dye Allergies.

## 2021-07-17 NOTE — Patient Instructions (Signed)

## 2021-08-02 NOTE — Progress Notes (Signed)
Randall White - 50 y.o. male MRN [id number]  Date of birth: 06/30/1971  Office Visit Note: Visit Date: 07/17/2021 PCP: Mayer Masker, PA-C Referred by: Mayer Masker, PA-C  Subjective: Chief Complaint  Patient presents with   Lower Back - Pain   Left Leg - Pain   Right Leg - Pain   HPI:  Randall White is a 50 y.o. male who comes in today at the request of Dr. Norlene Campbell for planned Left S1-2 Lumbar Transforaminal epidural steroid injection with fluoroscopic guidance.  The patient has failed conservative care including home exercise, medications, time and activity modification.  This injection will be diagnostic and hopefully therapeutic.  Please see requesting physician notes for further details and justification. MRI reviewed with images and spine model.  MRI reviewed in the note below.  Patient having mainly S1 referral pattern symptoms in an S1 dermatome.  He has L5-S1 facet arthropathy and some lateral recess or subarticular narrowing.  Depending on relief would look at diagnostic facet block.     ROS Otherwise per HPI.  Assessment & Plan: Visit Diagnoses:    ICD-10-CM   1. Lumbar radiculopathy  M54.16 XR C-ARM NO REPORT    Epidural Steroid injection    methylPREDNISolone acetate (DEPO-MEDROL) injection 80 mg      Plan: No additional findings.   Meds & Orders:  Meds ordered this encounter  Medications   methylPREDNISolone acetate (DEPO-MEDROL) injection 80 mg    Orders Placed This Encounter  Procedures   XR C-ARM NO REPORT   Epidural Steroid injection    Follow-up: Return if symptoms worsen or fail to improve.   Procedures: No procedures performed  S1 Lumbosacral Transforaminal Epidural Steroid Injection - Sub-Pedicular Approach with Fluoroscopic Guidance   Patient: Randall White      Date of Birth: [birth year] MRN: [id number] PCP: Mayer Masker, PA-C      Visit Date: 07/17/2021   Universal Protocol:    Date/Time: 09/14/227:51 PM  Consent  Given By: the patient  Position:  PRONE  Additional Comments: Vital signs were monitored before and after the procedure. Patient was prepped and draped in the usual sterile fashion. The correct patient, procedure, and site was verified.   Injection Procedure Details:  Procedure Site One Meds Administered:  Meds ordered this encounter  Medications   methylPREDNISolone acetate (DEPO-MEDROL) injection 80 mg    Laterality: Left  Location/Site:  S1 Foramen   Needle size: 22 ga.  Needle type: Spinal  Needle Placement: Transforaminal  Findings:   -Comments: Excellent flow of contrast along the nerve, nerve root and into the epidural space.  Epidurogram: Contrast epidurogram showed no nerve root cut off or restricted flow pattern.  Procedure Details: After squaring off the sacral end-plate to get a true AP view, the C-arm was positioned so that the best possible view of the S1 foramen was visualized. The soft tissues overlying this structure were infiltrated with 2-3 ml. of 1% Lidocaine without Epinephrine.    The spinal needle was inserted toward the target using a "trajectory" view along the fluoroscope beam.  Under AP and lateral visualization, the needle was advanced so it did not puncture dura. Biplanar projections were used to confirm position. Aspiration was confirmed to be negative for CSF and/or blood. A 1-2 ml. volume of Isovue-250 was injected and flow of contrast was noted at each level. Radiographs were obtained for documentation purposes.   After attaining the desired flow of contrast documented above, a 0.5 to 1.0  ml test dose of 0.25% Marcaine was injected into each respective transforaminal space.  The patient was observed for 90 seconds post injection.  After no sensory deficits were reported, and normal lower extremity motor function was noted,   the above injectate was administered so that equal amounts of the injectate were placed at each foramen (level) into the  transforaminal epidural space.   Additional Comments:  The patient tolerated the procedure well Dressing: Band-Aid with 2 x 2 sterile gauze    Post-procedure details: Patient was observed during the procedure. Post-procedure instructions were reviewed.  Patient left the clinic in stable condition.   Clinical History: MRI LUMBAR SPINE WITHOUT CONTRAST   TECHNIQUE: Multiplanar, multisequence MR imaging of the lumbar spine was performed. No intravenous contrast was administered.   COMPARISON:  Lumbar spine radiograph 05/10/2021   FINDINGS: Segmentation: 5 non rib-bearing lumbar type vertebral bodies are present. The lowest fully formed vertebral body is L5.   Alignment:  No significant listhesis is present.   Vertebrae:  Marrow signal and vertebral body heights are normal.   Conus medullaris and cauda equina: Conus extends to the L1-2 level. Conus and cauda equina appear normal.   Paraspinal and other soft tissues: Limited imaging the abdomen is unremarkable. There is no significant adenopathy. No solid organ lesions are present.   Disc levels:   L1-2: Negative.   L2-3: Mild disc bulging present.  No significant stenosis.   L3-4: Broad-based disc bulge is present. Mild facet hypertrophy and short pedicles noted. No significant stenosis.   L4-5: Disc bulging present. Short pedicles and mild facet hypertrophy noted. Mild foraminal narrowing is worse on the left.   L5-S1: A leftward disc protrusion is present. Asymmetric left-sided facet hypertrophy noted. Mild left subarticular and foraminal narrowing is present.   IMPRESSION: 1. Mild left subarticular and foraminal narrowing at L5-S1 secondary to a leftward disc protrusion and asymmetric left-sided facet hypertrophy. 2. Mild foraminal narrowing bilaterally at L4-5 is worse on the left. 3. Mild disc bulging and facet hypertrophy at L2-3 and L3-4 without significant stenosis. 4. 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     Electronically Signed   By: Marin Roberts M.D.   On: 06/05/2021 16:46     Objective:  VS:  HT:    WT:   BMI:     BP:(!) 142/93  HR:(!) 108bpm  TEMP: ( )  RESP:  Physical Exam Vitals and nursing note reviewed.  Constitutional:      General: He is not in acute distress.    Appearance: Normal appearance. He is not ill-appearing.  HENT:     Head: Normocephalic and atraumatic.     Right Ear: External ear normal.     Left Ear: External ear normal.     Nose: No congestion.  Eyes:     Extraocular Movements: Extraocular movements intact.  Cardiovascular:     Rate and Rhythm: Normal rate.     Pulses: Normal pulses.  Pulmonary:     Effort: Pulmonary effort is normal. No respiratory distress.  Abdominal:     General: There is no distension.     Palpations: Abdomen is soft.  Musculoskeletal:        General: No tenderness or signs of injury.     Cervical back: Neck supple.     Right lower leg: No edema.     Left lower leg: No edema.     Comments: Patient has good distal strength without clonus.  Skin:  Findings: No erythema or rash.  Neurological:     General: No focal deficit present.     Mental Status: He is alert and oriented to person, place, and time.     Sensory: No sensory deficit.     Motor: No weakness or abnormal muscle tone.     Coordination: Coordination normal.  Psychiatric:        Mood and Affect: Mood normal.        Behavior: Behavior normal.     Imaging: No results found.

## 2021-08-02 NOTE — Procedures (Signed)
S1 Lumbosacral Transforaminal Epidural Steroid Injection - Sub-Pedicular Approach with Fluoroscopic Guidance   Patient: Randall White      Date of Birth: [birth year] MRN: [id number] PCP: Mayer Masker, PA-C      Visit Date: 07/17/2021   Universal Protocol:    Date/Time: 09/14/227:51 PM  Consent Given By: the patient  Position:  PRONE  Additional Comments: Vital signs were monitored before and after the procedure. Patient was prepped and draped in the usual sterile fashion. The correct patient, procedure, and site was verified.   Injection Procedure Details:  Procedure Site One Meds Administered:  Meds ordered this encounter  Medications   methylPREDNISolone acetate (DEPO-MEDROL) injection 80 mg    Laterality: Left  Location/Site:  S1 Foramen   Needle size: 22 ga.  Needle type: Spinal  Needle Placement: Transforaminal  Findings:   -Comments: Excellent flow of contrast along the nerve, nerve root and into the epidural space.  Epidurogram: Contrast epidurogram showed no nerve root cut off or restricted flow pattern.  Procedure Details: After squaring off the sacral end-plate to get a true AP view, the C-arm was positioned so that the best possible view of the S1 foramen was visualized. The soft tissues overlying this structure were infiltrated with 2-3 ml. of 1% Lidocaine without Epinephrine.    The spinal needle was inserted toward the target using a "trajectory" view along the fluoroscope beam.  Under AP and lateral visualization, the needle was advanced so it did not puncture dura. Biplanar projections were used to confirm position. Aspiration was confirmed to be negative for CSF and/or blood. A 1-2 ml. volume of Isovue-250 was injected and flow of contrast was noted at each level. Radiographs were obtained for documentation purposes.   After attaining the desired flow of contrast documented above, a 0.5 to 1.0 ml test dose of 0.25% Marcaine was injected into  each respective transforaminal space.  The patient was observed for 90 seconds post injection.  After no sensory deficits were reported, and normal lower extremity motor function was noted,   the above injectate was administered so that equal amounts of the injectate were placed at each foramen (level) into the transforaminal epidural space.   Additional Comments:  The patient tolerated the procedure well Dressing: Band-Aid with 2 x 2 sterile gauze    Post-procedure details: Patient was observed during the procedure. Post-procedure instructions were reviewed.  Patient left the clinic in stable condition.

## 2021-09-26 ENCOUNTER — Other Ambulatory Visit: Payer: Self-pay | Admitting: Physician Assistant

## 2021-09-26 DIAGNOSIS — K219 Gastro-esophageal reflux disease without esophagitis: Secondary | ICD-10-CM

## 2021-10-03 ENCOUNTER — Other Ambulatory Visit: Payer: Self-pay

## 2021-10-03 ENCOUNTER — Ambulatory Visit (INDEPENDENT_AMBULATORY_CARE_PROVIDER_SITE_OTHER): Payer: BC Managed Care – PPO | Admitting: Physician Assistant

## 2021-10-03 ENCOUNTER — Encounter: Payer: Self-pay | Admitting: Physician Assistant

## 2021-10-03 VITALS — BP 173/97 | HR 90 | Temp 98.5°F | Ht 70.0 in | Wt 204.0 lb

## 2021-10-03 DIAGNOSIS — Z23 Encounter for immunization: Secondary | ICD-10-CM | POA: Diagnosis not present

## 2021-10-03 DIAGNOSIS — E785 Hyperlipidemia, unspecified: Secondary | ICD-10-CM

## 2021-10-03 DIAGNOSIS — Z1321 Encounter for screening for nutritional disorder: Secondary | ICD-10-CM

## 2021-10-03 DIAGNOSIS — Z1329 Encounter for screening for other suspected endocrine disorder: Secondary | ICD-10-CM | POA: Diagnosis not present

## 2021-10-03 DIAGNOSIS — K219 Gastro-esophageal reflux disease without esophagitis: Secondary | ICD-10-CM

## 2021-10-03 DIAGNOSIS — Z0000 Encounter for general adult medical examination without abnormal findings: Secondary | ICD-10-CM

## 2021-10-03 DIAGNOSIS — Z13228 Encounter for screening for other metabolic disorders: Secondary | ICD-10-CM

## 2021-10-03 DIAGNOSIS — Z130 Encounter for screening for diseases of the blood and blood-forming organs and certain disorders involving the immune mechanism: Secondary | ICD-10-CM

## 2021-10-03 DIAGNOSIS — Z1211 Encounter for screening for malignant neoplasm of colon: Secondary | ICD-10-CM | POA: Diagnosis not present

## 2021-10-03 MED ORDER — PANTOPRAZOLE SODIUM 20 MG PO TBEC: 20.0000 mg | DELAYED_RELEASE_TABLET | Freq: Every day | ORAL | 3 refills | Status: DC

## 2021-10-03 NOTE — Patient Instructions (Signed)

## 2021-10-03 NOTE — Progress Notes (Signed)
Male physical   Impression and Recommendations:    1. Healthcare maintenance   2. Hyperlipidemia, unspecified hyperlipidemia type   3. Screening for colon cancer   4. Need for shingles vaccine   5. Screening for endocrine, nutritional, metabolic and immunity disorder   6. Gastroesophageal reflux disease, unspecified whether esophagitis present      1) Anticipatory Guidance: Skin CA prevention- recommend to use sunscreen when outside along with skin surveillance; eat a balanced and modest diet; physical activity at least 25 minutes per day or minimum of 150 min/ week moderate to intense activity.  2) Immunizations / Screenings / Labs:   All immunizations are up-to-date per recommendations or will be updated today if pt allows.    - Patient understands with dental and vision screens they will schedule independently.  - Will obtain CBC, CMP, HgA1c, Lipid panel, TSH, pt fasting. - Declined influenza vaccine. Agreeable to Shingrix and colonoscopy.  3) Weight:  Recommend to continue to improve diet habits to improve overall feelings of well being and objective health data. Improve nutrient density of diet through increasing intake of fruits and vegetables and decreasing saturated fats, white flour products and refined sugars.   4) Healthcare Maintenance: -BP elevated in office, recommend stress reduction techniques. -Continue current medication regimen. Provided refill. GERD controlled. -Stay well hydrated. -Advised to schedule nurse visit for second dose of Shingrix in 2-6 months. -Follow up in 1 year for CPE and FBW   Orders Placed This Encounter  Procedures   Varicella-zoster vaccine IM   CBC with Differential/Platelet    Standing Status:   Future    Number of Occurrences:   1    Standing Expiration Date:   11/02/2021   Comprehensive metabolic panel    Standing Status:   Future    Number of Occurrences:   1    Standing Expiration Date:   11/02/2021    Order Specific  Question:   Has the patient fasted?    Answer:   Yes   Hemoglobin A1c    Standing Status:   Future    Number of Occurrences:   1    Standing Expiration Date:   11/02/2021   Lipid panel    Standing Status:   Future    Number of Occurrences:   1    Standing Expiration Date:   11/02/2021    Order Specific Question:   Has the patient fasted?    Answer:   Yes   TSH    Standing Status:   Future    Number of Occurrences:   1    Standing Expiration Date:   11/02/2021   Ambulatory referral to Gastroenterology    Referral Priority:   Routine    Referral Type:   Consultation    Referral Reason:   Specialty Services Required    Number of Visits Requested:   1     Meds ordered this encounter  Medications   pantoprazole (PROTONIX) 20 MG tablet    Sig: Take 1 tablet (20 mg total) by mouth daily.    Dispense:  90 tablet    Refill:  3    Order Specific Question:   Supervising Provider    Answer:   Nani Gasser D [[id number]]      Return in about 1 year (around 10/03/2022) for CPE and FBW; nurse visit in 2-6 months for second shingles dose .     Gross side effects, risk and benefits, and alternatives of medications  discussed with patient.  Patient is aware that all medications have potential side effects and we are unable to predict every side effect or drug-drug interaction that may occur.  Expresses verbal understanding and consents to current therapy plan and treatment regimen.  Please see AVS handed out to patient at the end of our visit for further patient instructions/ counseling done pertaining to today's office visit.       Subjective:        CC: CPE   HPI: Randall White is a 50 y.o. male who presents to [facility] today for a yearly health maintenance exam.     Health Maintenance Summary  - Reviewed and updated, unless pt declines services.  Last Cologuard or Colonoscopy:   will place order for screening colonoscopy Family  history of Colon CA:  no  Tobacco History Reviewed:   never a smoker, uses smokeless tobacco Alcohol / drug use:    No concerns, no excessive use / no use Male history: STD concerns:   none Additional penile/ urinary concerns: none   Additional concerns beyond Health Maintenance issues: none    Immunization History  Administered Date(s) Administered   PFIZER(Purple Top)SARS-COV-2 Vaccination 08/09/2020   Tdap 03/08/2015   Zoster Recombinat (Shingrix) 10/03/2021     Health Maintenance  Topic Date Due   COLONOSCOPY (Pts 45-86yrs Insurance coverage will need to be confirmed)  Never done   COVID-19 Vaccine (2 - Pfizer series) 08/30/2020   INFLUENZA VACCINE  Never done   Zoster Vaccines- Shingrix (2 of 2) 11/28/2021   TETANUS/TDAP  03/07/2025   Hepatitis C Screening  Completed   HIV Screening  Completed   Pneumococcal Vaccine 39-73 Years old  Aged Out   HPV VACCINES  Aged Out       Wt Readings from Last 3 Encounters:  10/03/21 204 lb (92.5 kg)  06/15/21 207 lb (93.9 kg)  05/10/21 207 lb (93.9 kg)   BP Readings from Last 3 Encounters:  10/03/21 (!) 173/97  07/17/21 (!) 142/93  10/03/20 130/88   Pulse Readings from Last 3 Encounters:  10/03/21 90  07/17/21 (!) 108  10/03/20 (!) 113    Patient Active Problem List   Diagnosis Date Noted   Low back pain 05/10/2021   External hemorrhoids without complication (HOB Positive 07/14/2018   Health education/counseling 07/14/2018   Overweight (BMI 25.0-29.9) 04/24/2018   FHx: early coronary artery disease-   father early 64s first heart attack 04/24/2018   Elevated blood pressure reading without diagnosis of hypertension 04/24/2018    History reviewed. No pertinent past medical history.  Past Surgical History:  Procedure Laterality Date   HERNIA REPAIR     KNEE SURGERY      Family History  Problem Relation Age of Onset   Heart attack Father    Leukemia Maternal Grandmother    Diabetes Paternal Grandmother      Social History   Substance and Sexual Activity  Drug Use Never  ,  Social History   Substance and Sexual Activity  Alcohol Use Yes  ,  Social History   Tobacco Use  Smoking Status Never  Smokeless Tobacco Current   Types: Snuff  ,  Social History   Substance and Sexual Activity  Sexual Activity Yes   Partners: Female    Patient's Medications  New Prescriptions   No medications on file  Previous Medications   IBUPROFEN (ADVIL,MOTRIN) 200 MG TABLET    Take 200 mg by  mouth every 6 (six) hours as needed.   PHENYLEPHRINE-APAP-GUAIFENESIN (VICKS SINEX SEVERE PO)    Take by mouth.  Modified Medications   Modified Medication Previous Medication   PANTOPRAZOLE (PROTONIX) 20 MG TABLET pantoprazole (PROTONIX) 20 MG tablet      Take 1 tablet (20 mg total) by mouth daily.    TAKE 1 TABLET BY MOUTH EVERY DAY  Discontinued Medications   CETIRIZINE (ZYRTEC) 10 MG TABLET    Take 10 mg by mouth daily.    Patient has no known allergies.  Review of Systems: General:   Denies fever, chills, unexplained weight loss.  Optho/Auditory:   Denies visual changes, blurred vision/LOV Respiratory:   Denies SOB, DOE more than baseline levels.   Cardiovascular:   Denies chest pain, palpitations, new onset peripheral edema  Gastrointestinal:   Denies nausea, vomiting, diarrhea.  Genitourinary: Denies dysuria, freq/ urgency, flank pain  Endocrine:     Denies hot or cold intolerance, polyuria, polydipsia. Musculoskeletal:   Denies unexplained myalgias, joint swelling, unexplained arthralgias, gait problems.  Skin:  Denies rash, suspicious lesions Neurological:     Denies dizziness, unexplained weakness, numbness  Psychiatric/Behavioral:   Denies mood changes, suicidal or homicidal ideations, hallucinations    Objective:     Blood pressure (!) 173/97, pulse 90, temperature 98.5 F (36.9 C), height 5\' 10"  (1.778 m), weight 204 lb (92.5 kg), SpO2 100 %. Body mass index is 29.27  kg/m. General Appearance:    Alert, cooperative, no distress, appears stated age  Head:    Normocephalic, without obvious abnormality, atraumatic  Eyes:    PERRL, conjunctiva/corneas clear, EOM's intact, fundi    benign, both eyes  Ears:    Normal TM's and external ear canals, both ears  Nose:   Nares normal, septum midline, mucosa- slightly pale, no drainage    or sinus tenderness  Throat:   Lips w/o lesion, mucosa moist, and tongue normal; teeth and gums normal  Neck:   Supple, symmetrical, trachea midline, no adenopathy;    thyroid:  no enlargement/tenderness/nodules; no JVD  Back:     Symmetric, no curvature, ROM normal, no CVA tenderness  Lungs:     Clear to auscultation bilaterally, respirations unlabored, no  Wh/ R/ R  Chest Wall:    No tenderness or gross deformity; normal excursion   Heart:    Regular rate and rhythm, S1 and S2 normal, no murmur, rub   or gallop  Abdomen:     Soft, non-tender, bowel sounds active all four quadrants, No G/R/R, no masses, no organomegaly  Genitalia:    Deferred.  Rectal:    Deferred.  Extremities:   Extremities normal, atraumatic, no cyanosis or gross edema  Pulses:   2+ and symmetric all extremities  Skin:   Warm, dry, Skin color, texture, turgor normal, no obvious rashes or lesions  M-Sk:   Ambulates * 4 w/o difficulty, no gross deformities, tone WNL  Neurologic:   CNII-XII grossly intact Psych:  No HI/SI, judgement and insight good, Euthymic mood. Full Affect.

## 2021-10-04 LAB — COMPREHENSIVE METABOLIC PANEL
ALT: 37 IU/L (ref 0–44)
AST: 30 IU/L (ref 0–40)
Albumin/Globulin Ratio: 2.2 (ref 1.2–2.2)
Albumin: 5.3 g/dL — ABNORMAL HIGH (ref 4.0–5.0)
Alkaline Phosphatase: 61 IU/L (ref 44–121)
BUN/Creatinine Ratio: 11 (ref 9–20)
BUN: 13 mg/dL (ref 6–24)
Bilirubin Total: 0.4 mg/dL (ref 0.0–1.2)
CO2: 25 mmol/L (ref 20–29)
Calcium: 10.4 mg/dL — ABNORMAL HIGH (ref 8.7–10.2)
Chloride: 101 mmol/L (ref 96–106)
Creatinine, Ser: 1.17 mg/dL (ref 0.76–1.27)
Globulin, Total: 2.4 g/dL (ref 1.5–4.5)
Glucose: 96 mg/dL (ref 70–99)
Potassium: 4.6 mmol/L (ref 3.5–5.2)
Sodium: 139 mmol/L (ref 134–144)
Total Protein: 7.7 g/dL (ref 6.0–8.5)
eGFR: 76 mL/min/{1.73_m2} (ref >59)

## 2021-10-04 LAB — HEMOGLOBIN A1C
Est. average glucose Bld gHb Est-mCnc: 108 mg/dL
Hgb A1c MFr Bld: 5.4 % (ref 4.8–5.6)

## 2021-10-04 LAB — LIPID PANEL
Chol/HDL Ratio: 4.5 ratio (ref 0.0–5.0)
Cholesterol, Total: 234 mg/dL — ABNORMAL HIGH (ref 100–199)
HDL: 52 mg/dL (ref >39)
LDL Chol Calc (NIH): 166 mg/dL — ABNORMAL HIGH (ref 0–99)
Triglycerides: 91 mg/dL (ref 0–149)
VLDL Cholesterol Cal: 16 mg/dL (ref 5–40)

## 2021-10-04 LAB — CBC WITH DIFFERENTIAL/PLATELET
Basophils Absolute: 0.1 10*3/uL (ref 0.0–0.2)
Basos: 1 %
EOS (ABSOLUTE): 0.1 10*3/uL (ref 0.0–0.4)
Eos: 2 %
Hematocrit: 41 % (ref 37.5–51.0)
Hemoglobin: 13.7 g/dL (ref 13.0–17.7)
Immature Grans (Abs): 0 10*3/uL (ref 0.0–0.1)
Immature Granulocytes: 1 %
Lymphocytes Absolute: 1 10*3/uL (ref 0.7–3.1)
Lymphs: 18 %
MCH: 30.5 pg (ref 26.6–33.0)
MCHC: 33.4 g/dL (ref 31.5–35.7)
MCV: 91 fL (ref 79–97)
Monocytes Absolute: 0.5 10*3/uL (ref 0.1–0.9)
Monocytes: 10 %
Neutrophils Absolute: 3.7 10*3/uL (ref 1.4–7.0)
Neutrophils: 68 %
Platelets: 347 10*3/uL (ref 150–450)
RBC: 4.49 x10E6/uL (ref 4.14–5.80)
RDW: 13.9 % (ref 11.6–15.4)
WBC: 5.4 10*3/uL (ref 3.4–10.8)

## 2021-10-04 LAB — TSH: TSH: 1.51 u[IU]/mL (ref 0.450–4.500)

## 2021-10-29 IMAGING — MR MR LUMBAR SPINE W/O CM
4 of 5 series · 27 of 48 positions shown · non-contrast
Comparison: Lumbar spine radiograph 05/10/2021

CLINICAL DATA: Low back pain for greater than 6 weeks. Chronic low
back pain and bilateral leg pain for 2 years.

EXAM:
MRI LUMBAR SPINE WITHOUT CONTRAST
TECHNIQUE: Multiplanar, multisequence MR imaging of the lumbar spine was
performed. No intravenous contrast was administered.

[Series 3: T2 · sagittal · 4.0mm · 1.09mm/px · 6 of 17 slices shown (1 of 2)]
[im 1/17]
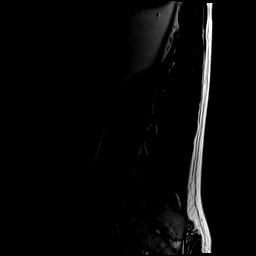
[im 4/17]
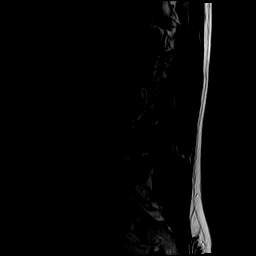
[im 7/17]
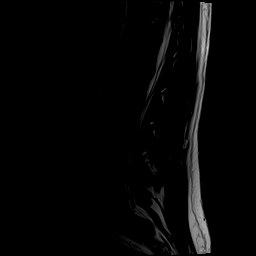
[im 10/17]
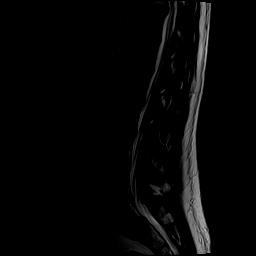
[im 13/17]
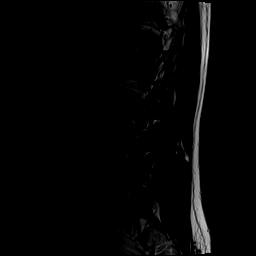
[im 17/17]
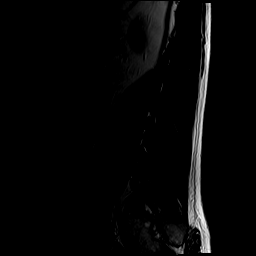

[Series 5: T1 · sagittal · 4.0mm · 1.09mm/px · 6 of 17 slices shown (1 of 2)]
[im 1/17]
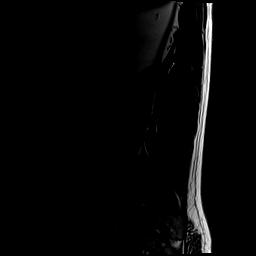
[im 4/17]
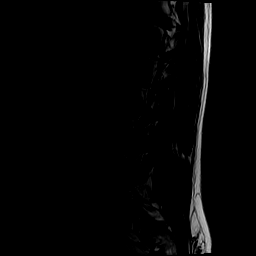
[im 7/17]
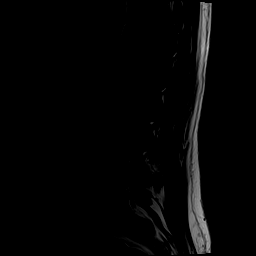
[im 10/17]
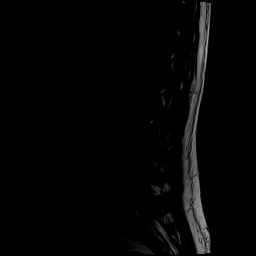
[im 13/17]
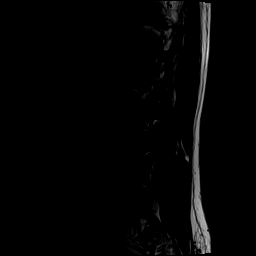
[im 17/17]
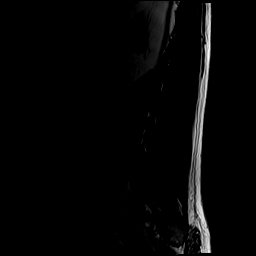

[Series 6: T2 · axial · 4.0mm · 0.39mm/px · z∈[-69,+165]mm · 9 of 44 slices shown (2 of 2)]
[im 1/44]
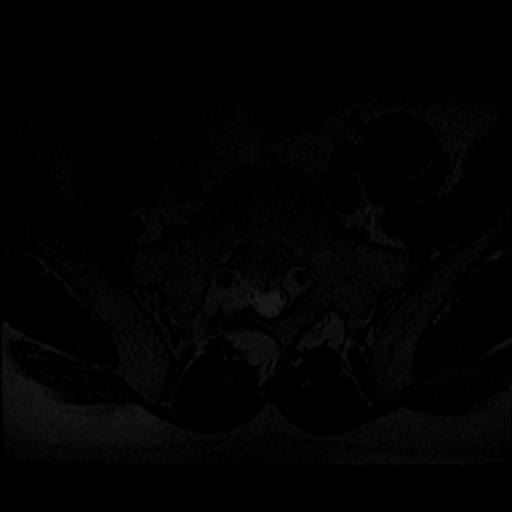
[im 7/44]
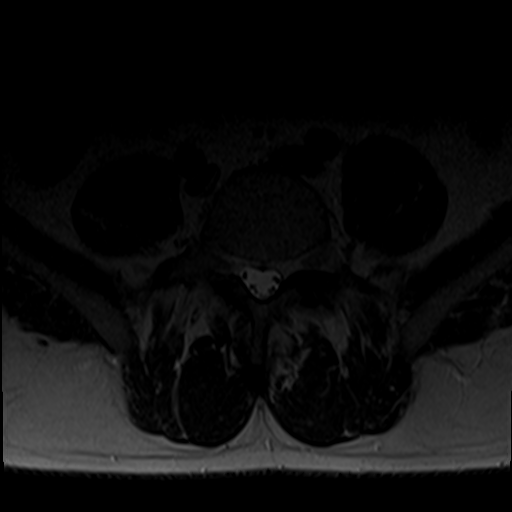
[im 13/44]
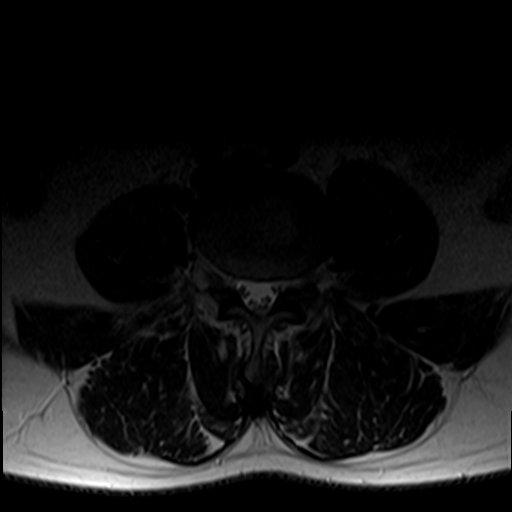
[im 19/44]
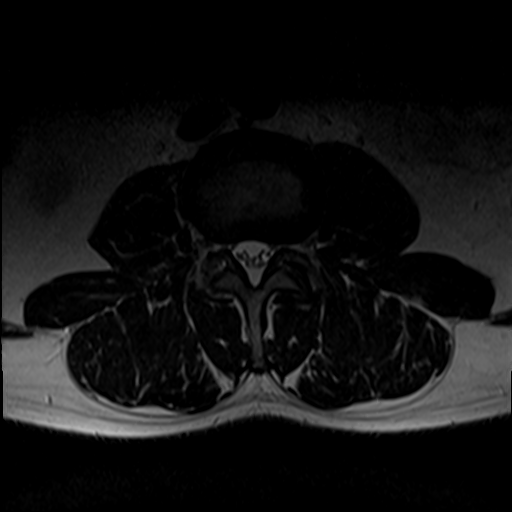
[im 22/44]
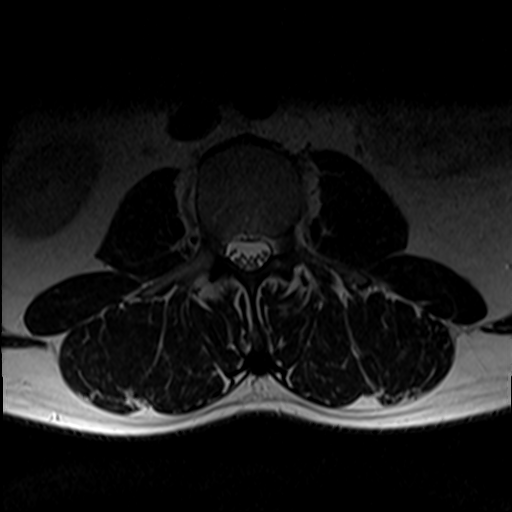
[im 25/44]
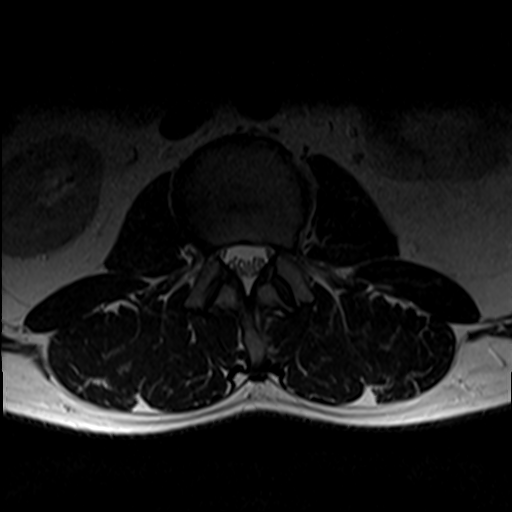
[im 31/44]
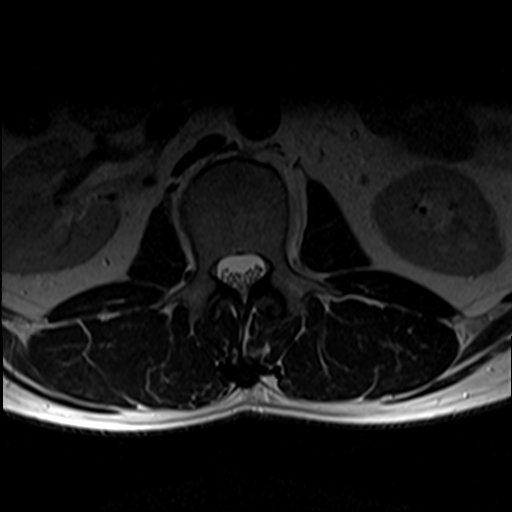
[im 37/44]
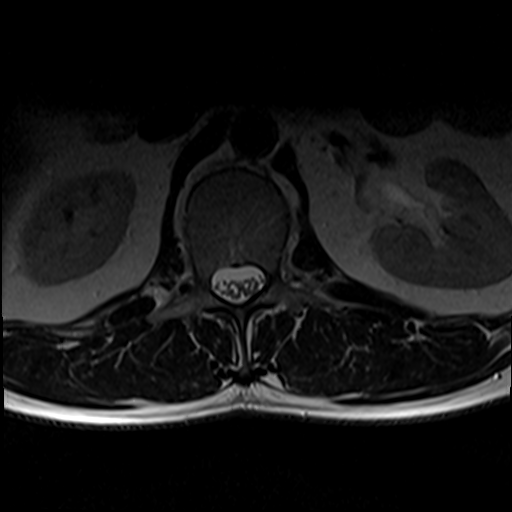
[im 44/44]
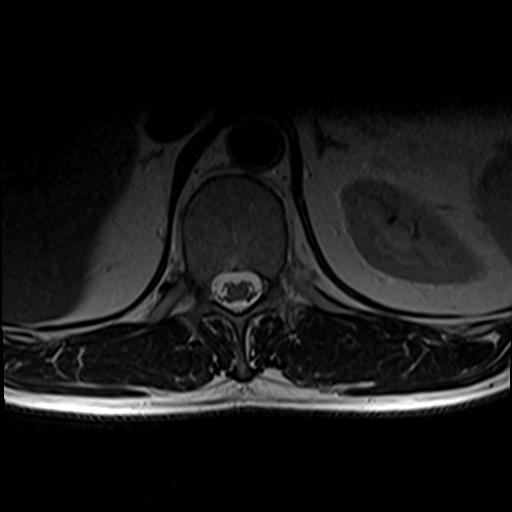

[Series 7: T1 · axial · 4.0mm · 0.39mm/px · z∈[-69,+132]mm · 6 of 44 slices shown (2 of 2)]
[im 1/44]
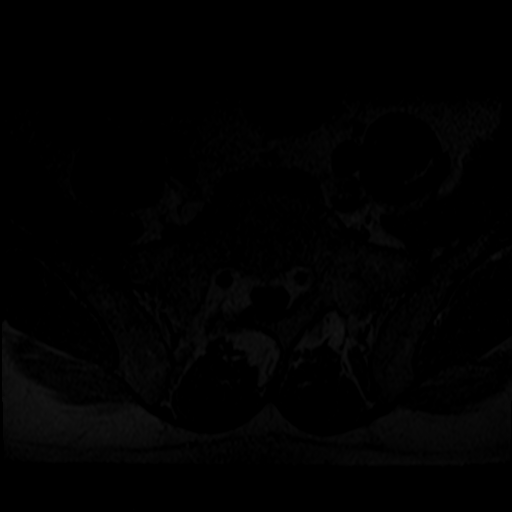
[im 7/44]
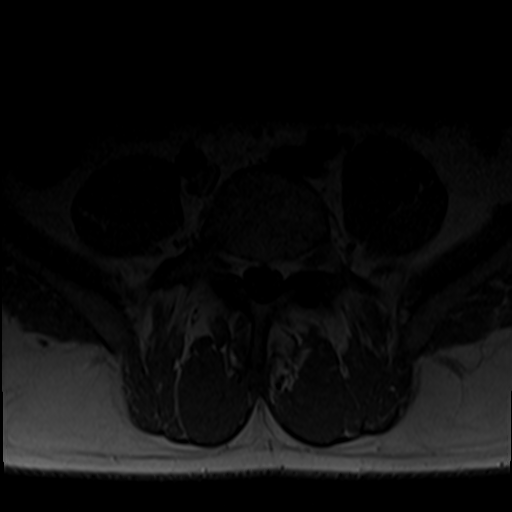
[im 13/44]
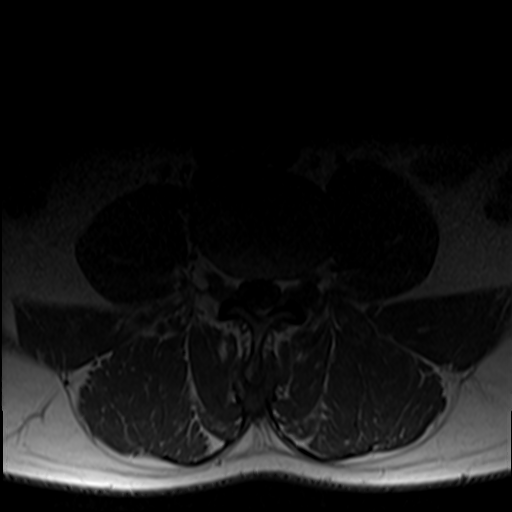
[im 19/44]
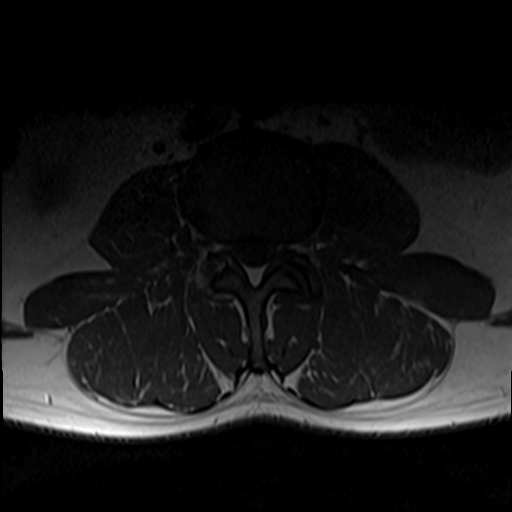
[im 22/44]
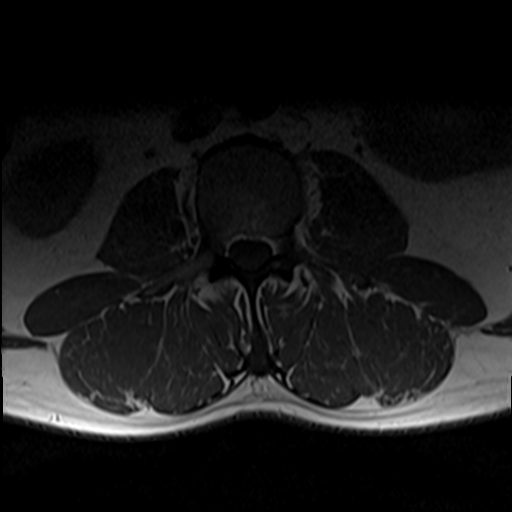
[im 37/44]
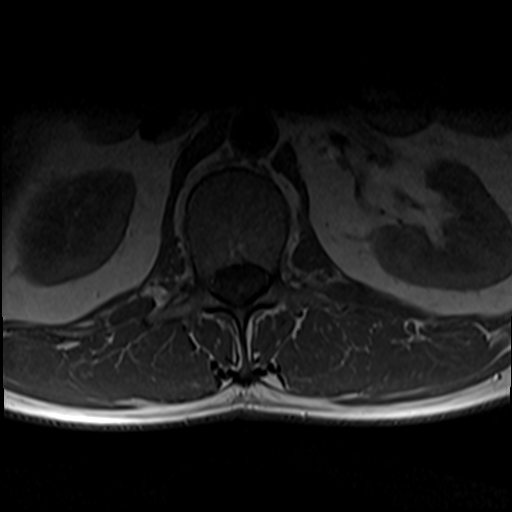

[27 of 48 positions shown; findings below may reference images not displayed]

FINDINGS: Segmentation: 5 non rib-bearing lumbar type vertebral bodies are
present. The lowest fully formed vertebral body is L5.

Alignment:  No significant listhesis is present.

Vertebrae:  Marrow signal and vertebral body heights are normal.

Conus medullaris and cauda equina: Conus extends to the L1-2 level.
Conus and cauda equina appear normal.

Paraspinal and other soft tissues: Limited imaging the abdomen is
unremarkable. There is no significant adenopathy. No solid organ
lesions are present.

Disc levels:

L1-2: Negative.

L2-3: Mild disc bulging present.  No significant stenosis.

L3-4: Broad-based disc bulge is present. Mild facet hypertrophy and
short pedicles noted. No significant stenosis.

L4-5: Disc bulging present. Short pedicles and mild facet
hypertrophy noted. Mild foraminal narrowing is worse on the left.

L5-S1: A leftward disc protrusion is present. Asymmetric left-sided
facet hypertrophy noted. Mild left subarticular and foraminal
narrowing is present.
IMPRESSION: 1. Mild left subarticular and foraminal narrowing at L5-S1 secondary
to a leftward disc protrusion and asymmetric left-sided facet
hypertrophy.
2. Mild foraminal narrowing bilaterally at L4-5 is worse on the
left.
3. Mild disc bulging and facet hypertrophy at L2-3 and L3-4 without
significant stenosis.
4. Short pedicles are present a component of congenital stenosis.

## 2022-01-04 ENCOUNTER — Ambulatory Visit: Payer: BC Managed Care – PPO

## 2022-01-11 ENCOUNTER — Ambulatory Visit: Payer: BC Managed Care – PPO

## 2022-01-11 ENCOUNTER — Other Ambulatory Visit: Payer: Self-pay

## 2022-01-11 ENCOUNTER — Ambulatory Visit (INDEPENDENT_AMBULATORY_CARE_PROVIDER_SITE_OTHER): Payer: BC Managed Care – PPO | Admitting: Nurse Practitioner

## 2022-01-11 VITALS — BP 164/98 | HR 76 | Temp 98.0°F | Ht 70.0 in | Wt 209.0 lb

## 2022-01-11 DIAGNOSIS — Z23 Encounter for immunization: Secondary | ICD-10-CM

## 2022-01-11 NOTE — Progress Notes (Signed)
Patient in office for 2nd shingles vaccine. Patient feeling well, no issues. Injection given in L arm. Tolerated well. VIS given. AS, CMA

## 2022-03-14 ENCOUNTER — Encounter: Payer: Self-pay | Admitting: Physician Assistant

## 2022-04-30 DIAGNOSIS — N401 Enlarged prostate with lower urinary tract symptoms: Secondary | ICD-10-CM | POA: Diagnosis not present

## 2022-04-30 DIAGNOSIS — R3915 Urgency of urination: Secondary | ICD-10-CM | POA: Diagnosis not present

## 2022-04-30 DIAGNOSIS — R3914 Feeling of incomplete bladder emptying: Secondary | ICD-10-CM | POA: Diagnosis not present

## 2022-06-13 DIAGNOSIS — R3914 Feeling of incomplete bladder emptying: Secondary | ICD-10-CM | POA: Diagnosis not present

## 2022-06-13 DIAGNOSIS — R3915 Urgency of urination: Secondary | ICD-10-CM | POA: Diagnosis not present

## 2022-06-13 DIAGNOSIS — N401 Enlarged prostate with lower urinary tract symptoms: Secondary | ICD-10-CM | POA: Diagnosis not present

## 2022-08-29 ENCOUNTER — Encounter: Payer: BC Managed Care – PPO | Admitting: Physician Assistant

## 2022-10-04 ENCOUNTER — Encounter: Payer: BC Managed Care – PPO | Admitting: Physician Assistant

## 2022-10-05 ENCOUNTER — Other Ambulatory Visit: Payer: Self-pay | Admitting: Physician Assistant

## 2022-10-05 DIAGNOSIS — K219 Gastro-esophageal reflux disease without esophagitis: Secondary | ICD-10-CM

## 2022-12-10 ENCOUNTER — Other Ambulatory Visit: Payer: Self-pay | Admitting: Nurse Practitioner

## 2022-12-10 DIAGNOSIS — E785 Hyperlipidemia, unspecified: Secondary | ICD-10-CM

## 2022-12-10 DIAGNOSIS — Z0000 Encounter for general adult medical examination without abnormal findings: Secondary | ICD-10-CM

## 2022-12-26 ENCOUNTER — Other Ambulatory Visit: Payer: Commercial Managed Care - PPO

## 2022-12-26 DIAGNOSIS — Z0000 Encounter for general adult medical examination without abnormal findings: Secondary | ICD-10-CM

## 2022-12-26 DIAGNOSIS — E785 Hyperlipidemia, unspecified: Secondary | ICD-10-CM

## 2022-12-27 LAB — CBC WITH DIFFERENTIAL/PLATELET
Basophils Absolute: 0.1 10*3/uL (ref 0.0–0.2)
Basos: 1 %
EOS (ABSOLUTE): 0.2 10*3/uL (ref 0.0–0.4)
Eos: 3 %
Hematocrit: 42.4 % (ref 37.5–51.0)
Hemoglobin: 14.5 g/dL (ref 13.0–17.7)
Immature Grans (Abs): 0 10*3/uL (ref 0.0–0.1)
Immature Granulocytes: 1 %
Lymphocytes Absolute: 1.2 10*3/uL (ref 0.7–3.1)
Lymphs: 18 %
MCH: 31.5 pg (ref 26.6–33.0)
MCHC: 34.2 g/dL (ref 31.5–35.7)
MCV: 92 fL (ref 79–97)
Monocytes Absolute: 0.6 10*3/uL (ref 0.1–0.9)
Monocytes: 9 %
Neutrophils Absolute: 4.6 10*3/uL (ref 1.4–7.0)
Neutrophils: 68 %
Platelets: 310 10*3/uL (ref 150–450)
RBC: 4.6 x10E6/uL (ref 4.14–5.80)
RDW: 13.7 % (ref 11.6–15.4)
WBC: 6.8 10*3/uL (ref 3.4–10.8)

## 2022-12-27 LAB — LIPID PANEL
Chol/HDL Ratio: 4.6 ratio (ref 0.0–5.0)
Cholesterol, Total: 223 mg/dL — ABNORMAL HIGH (ref 100–199)
HDL: 49 mg/dL (ref >39)
LDL Chol Calc (NIH): 152 mg/dL — ABNORMAL HIGH (ref 0–99)
Triglycerides: 121 mg/dL (ref 0–149)
VLDL Cholesterol Cal: 22 mg/dL (ref 5–40)

## 2022-12-27 LAB — COMPREHENSIVE METABOLIC PANEL
ALT: 30 IU/L (ref 0–44)
AST: 22 IU/L (ref 0–40)
Albumin/Globulin Ratio: 2.2 (ref 1.2–2.2)
Albumin: 4.9 g/dL (ref 3.8–4.9)
Alkaline Phosphatase: 63 IU/L (ref 44–121)
BUN/Creatinine Ratio: 13 (ref 9–20)
BUN: 14 mg/dL (ref 6–24)
Bilirubin Total: 0.5 mg/dL (ref 0.0–1.2)
CO2: 21 mmol/L (ref 20–29)
Calcium: 9.8 mg/dL (ref 8.7–10.2)
Chloride: 101 mmol/L (ref 96–106)
Creatinine, Ser: 1.09 mg/dL (ref 0.76–1.27)
Globulin, Total: 2.2 g/dL (ref 1.5–4.5)
Glucose: 106 mg/dL — ABNORMAL HIGH (ref 70–99)
Potassium: 4.6 mmol/L (ref 3.5–5.2)
Sodium: 138 mmol/L (ref 134–144)
Total Protein: 7.1 g/dL (ref 6.0–8.5)
eGFR: 82 mL/min/{1.73_m2} (ref >59)

## 2022-12-27 LAB — HEMOGLOBIN A1C
Est. average glucose Bld gHb Est-mCnc: 105 mg/dL
Hgb A1c MFr Bld: 5.3 % (ref 4.8–5.6)

## 2022-12-27 LAB — TSH: TSH: 2.14 u[IU]/mL (ref 0.450–4.500)

## 2023-01-02 ENCOUNTER — Encounter: Payer: BC Managed Care – PPO | Admitting: Nurse Practitioner

## 2023-01-05 ENCOUNTER — Other Ambulatory Visit: Payer: Self-pay | Admitting: Nurse Practitioner

## 2023-01-05 DIAGNOSIS — K219 Gastro-esophageal reflux disease without esophagitis: Secondary | ICD-10-CM

## 2023-02-05 ENCOUNTER — Other Ambulatory Visit: Payer: Self-pay | Admitting: Nurse Practitioner

## 2023-02-05 DIAGNOSIS — K219 Gastro-esophageal reflux disease without esophagitis: Secondary | ICD-10-CM

## 2023-02-06 ENCOUNTER — Encounter: Payer: Self-pay | Admitting: Family Medicine

## 2023-02-06 ENCOUNTER — Ambulatory Visit (INDEPENDENT_AMBULATORY_CARE_PROVIDER_SITE_OTHER): Payer: Commercial Managed Care - PPO | Admitting: Family Medicine

## 2023-02-06 VITALS — BP 166/116 | HR 68 | Resp 18 | Ht 70.0 in | Wt 207.0 lb

## 2023-02-06 DIAGNOSIS — I10 Essential (primary) hypertension: Secondary | ICD-10-CM

## 2023-02-06 DIAGNOSIS — K219 Gastro-esophageal reflux disease without esophagitis: Secondary | ICD-10-CM | POA: Diagnosis not present

## 2023-02-06 DIAGNOSIS — E7800 Pure hypercholesterolemia, unspecified: Secondary | ICD-10-CM | POA: Diagnosis not present

## 2023-02-06 DIAGNOSIS — Z0000 Encounter for general adult medical examination without abnormal findings: Secondary | ICD-10-CM

## 2023-02-06 MED ORDER — PANTOPRAZOLE SODIUM 20 MG PO TBEC: 20.0000 mg | DELAYED_RELEASE_TABLET | Freq: Every day | ORAL | 1 refills | Status: DC

## 2023-02-06 MED ORDER — VALSARTAN 160 MG PO TABS: 160.0000 mg | ORAL_TABLET | Freq: Every day | ORAL | 2 refills | Status: DC

## 2023-02-06 NOTE — Assessment & Plan Note (Signed)
Blood pressure elevated 174/124 on presentation, on repeat 15 minutes later 166/116.  Patient states he does not typically take his blood pressure at home.  Blood pressure has been elevated in the clinic previously.  We discussed starting valsartan for his blood pressure because it was still high.  Patient verbalized understanding and is agreeable to this plan.  Follow-up in 4 weeks to recheck blood pressure and adjust as necessary, most recent CBC and CMP within normal limits.  Encouraged ambulatory blood pressure monitoring and to bring blood pressure log to follow-up.  Will continue to monitor.

## 2023-02-06 NOTE — Assessment & Plan Note (Signed)
Last lipid panel: LDL 152, HDL 49.  LDL has been consistently elevated for several years.  We discussed starting with a referral to nutrition to discuss ways to incorporate both a low-sodium diet for blood pressure and low-carb/low-fat diet for cholesterol that also works well with his busy work schedule.

## 2023-02-06 NOTE — Assessment & Plan Note (Signed)
Stable, continue pantoprazole 20 mg daily.  Will discuss weaning off of it at future appointment.

## 2023-02-06 NOTE — Progress Notes (Signed)
Complete physical exam  Patient: Randall White   DOB: 1971/06/27   52 y.o. Male  MRN: [id number]  Subjective:    Chief Complaint  Patient presents with   Annual Exam    Randall White is a 52 y.o. male who presents today for a complete physical exam. He reports consuming a general diet. The patient does not participate in regular exercise at present. He generally feels well. He reports sleeping well. He does not have additional problems to discuss today.  Patient is still using dip tobacco. He has quit in the past and would like to do so again, he states he just needs to commit to doing so.  Most recent fall risk assessment:    02/06/2023   10:59 AM  Fall Risk   Falls in the past year? 0  Number falls in past yr: 0  Injury with Fall? 0  Risk for fall due to : No Fall Risks     Most recent depression screenings:    02/06/2023   10:59 AM 10/03/2021    8:26 AM  PHQ 2/9 Scores  PHQ - 2 Score 0 0  PHQ- 9 Score  0    Patient Active Problem List   Diagnosis Date Noted   Elevated LDL cholesterol level 02/06/2023   Gastroesophageal reflux disease 02/06/2023   Overweight (BMI 25.0-29.9) 04/24/2018   Primary hypertension 04/24/2018    Past Surgical History:  Procedure Laterality Date   HERNIA REPAIR     KNEE SURGERY     Social History   Tobacco Use   Smoking status: Never    Passive exposure: Never   Smokeless tobacco: Current    Types: Snuff  Vaping Use   Vaping Use: Never used  Substance Use Topics   Alcohol use: Yes   Drug use: Never   Family History  Problem Relation Age of Onset   Heart attack Father    Leukemia Maternal Grandmother    Diabetes Paternal Grandmother    No Known Allergies   Patient Care Team: Velva Harman, PA as PCP - General (Family Medicine) Garald Balding, MD (Inactive) as Consulting Physician (Orthopedic Surgery) Danella Sensing, MD as Consulting Physician (Dermatology) Lynnell Dike, OD (Optometry)   Outpatient  Medications Prior to Visit  Medication Sig   ibuprofen (ADVIL,MOTRIN) 200 MG tablet Take 200 mg by mouth every 6 (six) hours as needed.   Phenylephrine-APAP-guaiFENesin (VICKS SINEX SEVERE PO) Take by mouth.   [DISCONTINUED] pantoprazole (PROTONIX) 20 MG tablet TAKE 1 TABLET BY MOUTH EVERY DAY   No facility-administered medications prior to visit.    Review of Systems  Constitutional:  Negative for chills, fever and malaise/fatigue.  HENT:  Negative for congestion and hearing loss.   Eyes:  Negative for blurred vision and double vision.  Respiratory:  Negative for cough and shortness of breath.   Cardiovascular:  Negative for chest pain, palpitations and leg swelling.  Gastrointestinal:  Negative for abdominal pain, constipation, diarrhea and heartburn.  Genitourinary:  Negative for frequency and urgency.  Musculoskeletal:  Negative for myalgias and neck pain.  Neurological:  Negative for headaches.  Endo/Heme/Allergies:  Negative for polydipsia.  Psychiatric/Behavioral:  Negative for depression. The patient is not nervous/anxious.        Objective:    BP (!) 166/116 (BP Location: Right Arm, Patient Position: Sitting, Cuff Size: Large)   Pulse 68   Resp 18   Ht 5\' 10"  (1.778 m)   Wt 207 lb (93.9 kg)  SpO2 100%   BMI 29.70 kg/m    Physical Exam Constitutional:      General: He is not in acute distress.    Appearance: Normal appearance.  HENT:     Head: Normocephalic and atraumatic.     Right Ear: Tympanic membrane and ear canal normal.     Left Ear: Tympanic membrane and ear canal normal.     Nose: Nose normal.     Mouth/Throat:     Mouth: Mucous membranes are moist.     Pharynx: Oropharynx is clear.  Eyes:     Conjunctiva/sclera: Conjunctivae normal.     Pupils: Pupils are equal, round, and reactive to light.  Neck:     Thyroid: No thyromegaly or thyroid tenderness.  Cardiovascular:     Rate and Rhythm: Normal rate and regular rhythm.     Pulses: Normal pulses.      Heart sounds: Normal heart sounds.  Pulmonary:     Effort: Pulmonary effort is normal.     Breath sounds: Normal breath sounds.  Abdominal:     General: Bowel sounds are normal.     Palpations: Abdomen is soft.  Musculoskeletal:        General: Normal range of motion.     Cervical back: Normal range of motion and neck supple.  Lymphadenopathy:     Cervical: No cervical adenopathy.  Skin:    General: Skin is warm and dry.  Neurological:     Mental Status: He is alert and oriented to person, place, and time.  Psychiatric:        Mood and Affect: Mood normal.       Assessment & Plan:    Routine Health Maintenance and Physical Exam  Immunization History  Administered Date(s) Administered   PFIZER(Purple Top)SARS-COV-2 Vaccination 08/09/2020   Tdap 03/08/2015   Zoster Recombinat (Shingrix) 10/03/2021, 01/11/2022    Health Maintenance  Topic Date Due   COLONOSCOPY (Pts 45-25yrs Insurance coverage will need to be confirmed)  Never done   INFLUENZA VACCINE  02/17/2023 (Originally 06/19/2022)   COVID-19 Vaccine (2 - 2023-24 season) 02/22/2023 (Originally 07/20/2022)   DTaP/Tdap/Td (2 - Td or Tdap) 03/07/2025   Hepatitis C Screening  Completed   HIV Screening  Completed   Zoster Vaccines- Shingrix  Completed   HPV VACCINES  Aged Out   Colonoscopy scheduled for 03/01/2023.  Discussed health benefits of physical activity, and encouraged him to engage in regular exercise appropriate for his age and condition.  Wellness examination  Primary hypertension Assessment & Plan: Blood pressure elevated 174/124 on presentation, on repeat 15 minutes later 166/116.  Patient states he does not typically take his blood pressure at home.  Blood pressure has been elevated in the clinic previously.  We discussed starting valsartan for his blood pressure because it was still high.  Patient verbalized understanding and is agreeable to this plan.  Follow-up in 4 weeks to recheck blood pressure and  adjust as necessary, most recent CBC and CMP within normal limits.  Encouraged ambulatory blood pressure monitoring and to bring blood pressure log to follow-up.  Will continue to monitor.  Orders: -     Referral to Nutrition and Diabetes Services -     Valsartan; Take 1 tablet (160 mg total) by mouth daily.  Dispense: 30 tablet; Refill: 2  Elevated LDL cholesterol level Assessment & Plan: Last lipid panel: LDL 152, HDL 49.  LDL has been consistently elevated for several years.  We discussed starting with a referral  to nutrition to discuss ways to incorporate both a low-sodium diet for blood pressure and low-carb/low-fat diet for cholesterol that also works well with his busy work schedule.  Orders: -     Referral to Nutrition and Diabetes Services  Gastroesophageal reflux disease, unspecified whether esophagitis present Assessment & Plan: Stable, continue pantoprazole 20 mg daily.  Will discuss weaning off of it at future appointment.  Orders: -     Pantoprazole Sodium; Take 1 tablet (20 mg total) by mouth daily.  Dispense: 90 tablet; Refill: 1  Return in about 4 weeks (around 03/06/2023) for follow-up for blood pressure.   Velva Harman, PA

## 2023-02-07 ENCOUNTER — Ambulatory Visit (AMBULATORY_SURGERY_CENTER): Payer: Commercial Managed Care - PPO

## 2023-02-07 VITALS — Ht 70.0 in | Wt 207.0 lb

## 2023-02-07 DIAGNOSIS — Z1211 Encounter for screening for malignant neoplasm of colon: Secondary | ICD-10-CM

## 2023-02-07 MED ORDER — NA SULFATE-K SULFATE-MG SULF 17.5-3.13-1.6 GM/177ML PO SOLN: 1.0000 | Freq: Once | ORAL | 0 refills | Status: AC

## 2023-02-07 NOTE — Progress Notes (Signed)

## 2023-02-12 ENCOUNTER — Encounter: Payer: Self-pay | Admitting: Gastroenterology

## 2023-02-28 ENCOUNTER — Encounter: Payer: Self-pay | Admitting: Certified Registered Nurse Anesthetist

## 2023-03-01 ENCOUNTER — Encounter: Payer: Self-pay | Admitting: Gastroenterology

## 2023-03-01 ENCOUNTER — Ambulatory Visit (AMBULATORY_SURGERY_CENTER): Payer: Commercial Managed Care - PPO | Admitting: Gastroenterology

## 2023-03-01 VITALS — BP 92/54 | HR 61 | Temp 98.9°F | Resp 16 | Ht 70.0 in | Wt 200.0 lb

## 2023-03-01 DIAGNOSIS — D120 Benign neoplasm of cecum: Secondary | ICD-10-CM | POA: Diagnosis not present

## 2023-03-01 DIAGNOSIS — Z1211 Encounter for screening for malignant neoplasm of colon: Secondary | ICD-10-CM | POA: Diagnosis present

## 2023-03-01 MED ORDER — SODIUM CHLORIDE 0.9 % IV SOLN: 500.0000 mL | INTRAVENOUS | Status: DC

## 2023-03-01 NOTE — Progress Notes (Unsigned)
   Referring Provider: Melida Quitter, PA Primary Care Physician:  Melida Quitter, PA  Indication for Colonoscopy:  Colon cancer screening   IMPRESSION:  Need for colon cancer screening Appropriate candidate for monitored anesthesia care  PLAN: EGD Colonoscopy in the [facility] today   HPI: Randall White is a 52 y.o. male presents for screening colonoscopy.  No prior colonoscopy or colon cancer screening.  No known family history of colon cancer or polyps. No family history of uterine/endometrial cancer, pancreatic cancer or gastric/stomach cancer.   Past Medical History:  Diagnosis Date   GERD (gastroesophageal reflux disease)    Hypertension     Past Surgical History:  Procedure Laterality Date   HERNIA REPAIR     KNEE SURGERY      Current Outpatient Medications  Medication Sig Dispense Refill   ibuprofen (ADVIL,MOTRIN) 200 MG tablet Take 200 mg by mouth every 6 (six) hours as needed.     pantoprazole (PROTONIX) 20 MG tablet Take 1 tablet (20 mg total) by mouth daily. 90 tablet 1   Phenylephrine-APAP-guaiFENesin (VICKS SINEX SEVERE PO) Take by mouth.     valsartan (DIOVAN) 160 MG tablet Take 1 tablet (160 mg total) by mouth daily. 30 tablet 2   Current Facility-Administered Medications  Medication Dose Route Frequency Provider Last Rate Last Admin   0.9 %  sodium chloride infusion  500 mL Intravenous Continuous Tressia Danas, MD        Allergies as of 03/01/2023   (No Known Allergies)    Family History  Problem Relation Age of Onset   Heart attack Father    Leukemia Maternal Grandmother    Diabetes Paternal Grandmother    Colon cancer Neg Hx    Colon polyps Neg Hx    Esophageal cancer Neg Hx    Rectal cancer Neg Hx    Stomach cancer Neg Hx      Physical Exam: General:   Alert,  well-nourished, pleasant and cooperative in NAD Head:  Normocephalic and atraumatic. Eyes:  Sclera clear, no icterus.   Conjunctiva pink. Mouth:  No deformity or  lesions.   Neck:  Supple; no masses or thyromegaly. Lungs:  Clear throughout to auscultation.   No wheezes. Heart:  Regular rate and rhythm; no murmurs. Abdomen:  Soft, non-tender, nondistended, normal bowel sounds, no rebound or guarding.  Msk:  Symmetrical. No boney deformities LAD: No inguinal or umbilical LAD Extremities:  No clubbing or edema. Neurologic:  Alert and  oriented x4;  grossly nonfocal Skin:  No obvious rash or bruise. Psych:  Alert and cooperative. Normal mood and affect.     Studies/Results: No results found.    Maleea Camilo L. Orvan Falconer, MD, MPH 03/01/2023, 1:48 PM

## 2023-03-01 NOTE — Progress Notes (Signed)
Called to room to assist during endoscopic procedure.  Patient ID and intended procedure confirmed with present staff. Received instructions for my participation in the procedure from the performing physician.  

## 2023-03-01 NOTE — Patient Instructions (Addendum)
Continue present medications. Await pathology results. Repeat colonoscopy date to be determined after pending pathology results are reviewed for surveillance. Emerging evidence supports eating a diet of  fruits, vegetables, grains, calcium, and yogurt while reducing red meat and alcohol may reduce the while reducing red meat and alcohol may reduce the risk of colon cancer. Thank you for allowing me to be involved in your colon cancer prevention.  Please read over handout about polyps                           YOU HAD AN ENDOSCOPIC PROCEDURE TODAY AT THE [facility]:   Refer to the procedure report that was given to you for any specific questions about what was found during the examination.  If the procedure report does not answer your questions, please call your gastroenterologist to clarify.  If you requested that your care partner not be given the details of your procedure findings, then the procedure report has been included in a sealed envelope for you to review at your convenience later.  YOU SHOULD EXPECT: Some feelings of bloating in the abdomen. Passage of more gas than usual.  Walking can help get rid of the air that was put into your GI tract during the procedure and reduce the bloating. If you had a lower endoscopy (such as a colonoscopy or flexible sigmoidoscopy) you may notice spotting of blood in your stool or on the toilet paper. If you underwent a bowel prep for your procedure, you may not have a normal bowel movement for a few days.  Please Note:  You might notice some irritation and congestion in your nose or some drainage.  This is from the oxygen used during your procedure.  There is no need for concern and it should clear up in a day or so.  SYMPTOMS TO REPORT IMMEDIATELY:  Following lower endoscopy (colonoscopy or flexible sigmoidoscopy):  Excessive amounts of blood in the stool  Significant tenderness or worsening of abdominal pains  Swelling of the abdomen that  is new, acute  Fever of 100F or higher  For urgent or emergent issues, a gastroenterologist can be reached at any hour by calling [phone]. Do not use MyChart messaging for urgent concerns.    DIET:  We do recommend a small meal at first, but then you may proceed to your regular diet.  Drink plenty of fluids but you should avoid alcoholic beverages for 24 hours.  ACTIVITY:  You should plan to take it easy for the rest of today and you should NOT DRIVE or use heavy machinery until tomorrow (because of the sedation medicines used during the test).    FOLLOW UP: Our staff will call the number listed on your records the next business day following your procedure.  We will call around 7:15- 8:00 am to check on you and address any questions or concerns that you may have regarding the information given to you following your procedure. If we do not reach you, we will leave a message.     If any biopsies were taken you will be contacted by phone or by letter within the next 1-3 weeks.  Please call us at [phone] if you have not heard about the biopsies in 3 weeks.    SIGNATURES/CONFIDENTIALITY: You and/or your care partner have signed paperwork which will be entered into your electronic medical record.  These signatures attest to the fact that that the information above  on your After Visit Summary has been reviewed and is understood.  Full responsibility of the confidentiality of this discharge information lies with you and/or your care-partner.

## 2023-03-01 NOTE — Op Note (Signed)
[facility] Patient Name: Randall White Procedure Date: 03/01/2023 1:48 PM MRN: [id number] Endoscopist: Tressia Danas MD, MD, [id number] Age: 52 Referring MD:  Date of Birth: [birth year] Gender: Male Account #: [id number] Procedure:                Colonoscopy Indications:              Screening for colorectal malignant neoplasm, This                            is the patient's first colonoscopy                           No known family history of colon cancer or polyps Medicines:                Monitored Anesthesia Care Procedure:                Pre-Anesthesia Assessment:                           - Prior to the procedure, a History and Physical                            was performed, and patient medications and                            allergies were reviewed. The patient's tolerance of                            previous anesthesia was also reviewed. The risks                            and benefits of the procedure and the sedation                            options and risks were discussed with the patient.                            All questions were answered, and informed consent                            was obtained. Prior Anticoagulants: The patient has                            taken no anticoagulant or antiplatelet agents. ASA                            Grade Assessment: II - A patient with mild systemic                            disease. After reviewing the risks and benefits,                            the patient was deemed in satisfactory condition to  undergo the procedure.                           After obtaining informed consent, the colonoscope                            was passed under direct vision. Throughout the                            procedure, the patient's blood pressure, pulse, and                            oxygen saturations were monitored continuously. The                            CF [id number] #[id number]  was introduced through the anus                            and advanced to the 3 cm into the ileum. A second                            forward view of the right colon was performed. The                            colonoscopy was performed without difficulty. The                            patient tolerated the procedure well. The quality                            of the bowel preparation was excellent. The                            terminal ileum, ileocecal valve, appendiceal                            orifice, and rectum were photographed. Scope In: 2:13:45 PM Scope Out: 2:32:04 PM Scope Withdrawal Time: 0 hours 15 minutes 10 seconds  Total Procedure Duration: 0 hours 18 minutes 19 seconds  Findings:                 The perianal and digital rectal examinations were                            normal.                           Two sessile polyps were found in the cecum. The                            polyps were less than 1 mm in size. These polyps                            were removed with a cold biopsy forceps. Resection  and retrieval were complete. Estimated blood loss                            was minimal.                           The exam was otherwise without abnormality on                            direct and retroflexion views. Complications:            No immediate complications. Estimated blood loss:                            None. Estimated Blood Loss:     Estimated blood loss: none. Impression:               - Two less than 1 mm polyps in the cecum, removed                            with a cold biopsy forceps. Resected and retrieved.                           - The examination was otherwise normal on direct                            and retroflexion views. Recommendation:           - Patient has a contact number available for                            emergencies. The signs and symptoms of potential                            delayed  complications were discussed with the                            patient. Return to normal activities tomorrow.                            Written discharge instructions were provided to the                            patient.                           - Resume previous diet.                           - Continue present medications.                           - Await pathology results.                           - Repeat colonoscopy date to be determined after  pending pathology results are reviewed for                            surveillance.                           - Emerging evidence supports eating a diet of                            fruits, vegetables, grains, calcium, and yogurt                            while reducing red meat and alcohol may reduce the                            risk of colon cancer.                           - Thank you for allowing me to be involved in your                            colon cancer prevention. Tressia Danas MD, MD 03/01/2023 2:49:51 PM This report has been signed electronically.

## 2023-03-01 NOTE — Progress Notes (Unsigned)
Pt's states no medical or surgical changes since previsit or office visit. 

## 2023-03-01 NOTE — Progress Notes (Unsigned)
Report given to PACU, vss 

## 2023-03-02 ENCOUNTER — Other Ambulatory Visit: Payer: Self-pay | Admitting: Family Medicine

## 2023-03-02 DIAGNOSIS — I10 Essential (primary) hypertension: Secondary | ICD-10-CM

## 2023-03-04 ENCOUNTER — Telehealth: Payer: Self-pay

## 2023-03-04 NOTE — Telephone Encounter (Signed)
  Follow up Call-     03/01/2023    1:41 PM  Call back number  Post procedure Call Back phone  # [phone]  Permission to leave phone message Yes     Patient questions:  Do you have a fever, pain , or abdominal swelling? No. Pain Score  0 *  Have you tolerated food without any problems? Yes.    Have you been able to return to your normal activities? Yes.    Do you have any questions about your discharge instructions: Diet   No. Medications  No. Follow up visit  No.  Do you have questions or concerns about your Care? No.  Actions: * If pain score is 4 or above: No action needed, pain <4.

## 2023-03-06 ENCOUNTER — Encounter: Payer: Self-pay | Admitting: Family Medicine

## 2023-03-06 ENCOUNTER — Ambulatory Visit: Payer: Commercial Managed Care - PPO | Admitting: Family Medicine

## 2023-03-06 VITALS — BP 146/92 | HR 86 | Resp 18 | Ht 70.0 in | Wt 206.0 lb

## 2023-03-06 DIAGNOSIS — I10 Essential (primary) hypertension: Secondary | ICD-10-CM

## 2023-03-06 MED ORDER — VALSARTAN 320 MG PO TABS
320.0000 mg | ORAL_TABLET | Freq: Every day | ORAL | 3 refills | Status: DC
Start: 2023-03-06 — End: 2024-02-24

## 2023-03-06 NOTE — Patient Instructions (Addendum)
Keep doing a great job of taking your valsartan every day.  I am hopeful that with this increase in dose we will get your blood pressure to the goal.  There is also some great information in the back of your after visit summary about a low-sodium diet commonly called the "DASH eating plan".  It has been proven to be very effective for helping blood pressure in addition to medication.

## 2023-03-06 NOTE — Progress Notes (Signed)
   Established Patient Office Visit  Subjective   Patient ID: Randall White, male    DOB: 09-29-1971  Age: 52 y.o. MRN: [id number]  Chief Complaint  Patient presents with   Hypertension    HPI Randall White is a 52 y.o. male presenting today for follow up of hypertension. Hypertension: Patient here for follow-up of elevated blood pressure.  At last appointment, blood pressure elevated 174/124 on presentation and remained elevated 166/116 on repeat.  Taking valsartan 160 mg which was started at his last visit, reports excellent compliance with treatment. Denies side effects. Pt denies chest pain, SOB, dizziness, edema, syncope, fatigue or heart palpitations.   ROS Negative unless otherwise noted in HPI   Objective:     BP (!) 146/92 (BP Location: Left Arm, Patient Position: Sitting, Cuff Size: Normal)   Pulse 86   Resp 18   Ht  (1.778 m)   Wt 206 lb (93.4 kg)   SpO2 96%   BMI 29.56 kg/m   Physical Exam Constitutional:      General: He is not in acute distress.    Appearance: Normal appearance.  HENT:     Head: Normocephalic and atraumatic.  Cardiovascular:     Rate and Rhythm: Normal rate and regular rhythm.     Pulses: Normal pulses.     Heart sounds: Normal heart sounds. No murmur heard.    No friction rub. No gallop.  Pulmonary:     Effort: Pulmonary effort is normal. No respiratory distress.     Breath sounds: Normal breath sounds. No wheezing, rhonchi or rales.  Skin:    General: Skin is warm and dry.     Coloration: Skin is not jaundiced or pale.  Neurological:     Mental Status: He is alert and oriented to person, place, and time.  Psychiatric:        Mood and Affect: Mood normal.     Assessment & Plan:  Primary hypertension Assessment & Plan: Goal blood pressure less than 130/80 due to history of hyperlipidemia.  Blood pressure on presentation today 155/100, on repeat 146/92.  This is lower than his last appointment but still higher than goal.   We discussed increasing valsartan to 320 mg daily or starting a combination valsartan-HCTZ 160-12.5 mg daily tablet.  Patient would rather take a higher dose of the valsartan since he knows that he is tolerating it well.  Sent in prescription, starting valsartan 320 mg daily.  Checking CMP today for electrolytes and kidney function.  Follow-up in 4 weeks for repeat CMP and blood pressure check.  Orders: -     Comprehensive metabolic panel; Future -     Valsartan; Take 1 tablet (320 mg total) by mouth daily.  Dispense: 90 tablet; Refill: 3    Return in about 4 weeks (around 04/03/2023) for follow-up for HTN after increasing valsartan.    Melida Quitter, PA

## 2023-03-06 NOTE — Assessment & Plan Note (Addendum)
Goal blood pressure less than 130/80 due to history of hyperlipidemia.  Blood pressure on presentation today 155/100, on repeat 146/92.  This is lower than his last appointment but still higher than goal.  We discussed increasing valsartan to 320 mg daily or starting a combination valsartan-HCTZ 160-12.5 mg daily tablet.  Patient would rather take a higher dose of the valsartan since he knows that he is tolerating it well.  Sent in prescription, starting valsartan 320 mg daily.  Checking CMP today for electrolytes and kidney function.  Follow-up in 4 weeks for repeat CMP and blood pressure check.

## 2023-03-07 LAB — COMPREHENSIVE METABOLIC PANEL
ALT: 43 IU/L (ref 0–44)
AST: 30 IU/L (ref 0–40)
Albumin/Globulin Ratio: 2.5 — ABNORMAL HIGH (ref 1.2–2.2)
Albumin: 5.2 g/dL — ABNORMAL HIGH (ref 3.8–4.9)
Alkaline Phosphatase: 58 IU/L (ref 44–121)
BUN/Creatinine Ratio: 11 (ref 9–20)
BUN: 13 mg/dL (ref 6–24)
Bilirubin Total: 0.4 mg/dL (ref 0.0–1.2)
CO2: 23 mmol/L (ref 20–29)
Calcium: 10.3 mg/dL — ABNORMAL HIGH (ref 8.7–10.2)
Chloride: 104 mmol/L (ref 96–106)
Creatinine, Ser: 1.14 mg/dL (ref 0.76–1.27)
Globulin, Total: 2.1 g/dL (ref 1.5–4.5)
Glucose: 114 mg/dL — ABNORMAL HIGH (ref 70–99)
Potassium: 5.7 mmol/L — ABNORMAL HIGH (ref 3.5–5.2)
Sodium: 141 mmol/L (ref 134–144)
Total Protein: 7.3 g/dL (ref 6.0–8.5)
eGFR: 77 mL/min/{1.73_m2} (ref >59)

## 2023-03-10 ENCOUNTER — Encounter: Payer: Self-pay | Admitting: Gastroenterology

## 2023-04-03 ENCOUNTER — Encounter: Payer: Self-pay | Admitting: Family Medicine

## 2023-04-03 ENCOUNTER — Ambulatory Visit: Payer: Commercial Managed Care - PPO | Admitting: Family Medicine

## 2023-04-03 VITALS — BP 137/90 | HR 78 | Resp 18 | Ht 70.0 in | Wt 204.0 lb

## 2023-04-03 DIAGNOSIS — I10 Essential (primary) hypertension: Secondary | ICD-10-CM | POA: Diagnosis not present

## 2023-04-03 NOTE — Assessment & Plan Note (Signed)
Blood pressure above goal, even on repeat.  Tolerating increase in medication well, but he does recognize that he does eat a lot of salt/sodium in his diet.  He would like to try to make some dietary changes in the next month before increasing his medication.  We discussed that stress and sodium can have a significant impact in blood pressure, so if he is able to make those changes we can likely avoid changing his medication.  Will follow-up in 4 weeks, if blood pressure at goal will continue valsartan 320 mg daily with low-sodium diet.  If blood pressure not at goal, will change to valsartan-HCTZ 320-12.5 mg daily with low-sodium diet.  Repeat CMP at follow-up appointment.

## 2023-04-03 NOTE — Progress Notes (Signed)
   Established Patient Office Visit  Subjective   Patient ID: Randall White, male    DOB: September 05, 1971  Age: 52 y.o. MRN: [id number]  Chief Complaint  Patient presents with   Hypertension    Hypertension   [name] is a 52 y.o. male presenting today for follow up of hypertension.  He is tolerating the increase in valsartan to 320 mg daily well.  He brought his blood pressure log from home, numbers have most often been in the 130s systolic and in the 80s diastolic.  He still does have the occasional reading that is above 140/90.  He states that ideally, he would like to avoid medication as much as possible.  ROS Negative unless otherwise noted in HPI   Objective:     BP (!) 137/90 (BP Location: Right Arm, Patient Position: Sitting, Cuff Size: Large)   Pulse 78   Resp 18   Ht 5\' 10"  (1.778 m)   Wt 204 lb (92.5 kg)   SpO2 99%   BMI 29.27 kg/m   Physical Exam Constitutional:      General: He is not in acute distress.    Appearance: Normal appearance.  HENT:     Head: Normocephalic and atraumatic.  Cardiovascular:     Rate and Rhythm: Normal rate and regular rhythm.     Pulses: Normal pulses.     Heart sounds: No murmur heard.    No friction rub. No gallop.  Pulmonary:     Effort: Pulmonary effort is normal.     Breath sounds: No wheezing, rhonchi or rales.  Skin:    General: Skin is warm and dry.  Neurological:     Mental Status: He is alert and oriented to person, place, and time.  Psychiatric:        Mood and Affect: Mood normal.     Assessment & Plan:  Primary hypertension Assessment & Plan: Blood pressure above goal, even on repeat.  Tolerating increase in medication well, but he does recognize that he does eat a lot of salt/sodium in his diet.  He would like to try to make some dietary changes in the next month before increasing his medication.  We discussed that stress and sodium can have a significant impact in blood pressure, so if he is able to make  those changes we can likely avoid changing his medication.  Will follow-up in 4 weeks, if blood pressure at goal will continue valsartan 320 mg daily with low-sodium diet.  If blood pressure not at goal, will change to valsartan-HCTZ 320-12.5 mg daily with low-sodium diet.  Repeat CMP at follow-up appointment.     Return in about 4 weeks (around 05/01/2023) for follow-up for HTN.    Melida Quitter, PA

## 2023-05-06 ENCOUNTER — Ambulatory Visit: Payer: Commercial Managed Care - PPO | Admitting: Family Medicine

## 2023-08-18 ENCOUNTER — Other Ambulatory Visit: Payer: Self-pay | Admitting: Family Medicine

## 2023-08-18 DIAGNOSIS — I10 Essential (primary) hypertension: Secondary | ICD-10-CM

## 2023-08-18 DIAGNOSIS — K219 Gastro-esophageal reflux disease without esophagitis: Secondary | ICD-10-CM

## 2023-08-21 ENCOUNTER — Telehealth: Payer: Self-pay | Admitting: *Deleted

## 2023-08-21 NOTE — Telephone Encounter (Signed)
LVM to call office to get appointment rescheduled per provider.  Will also send [employer].

## 2023-08-21 NOTE — Telephone Encounter (Signed)
-----   Message from Melida Quitter sent at 07/29/2023  4:29 PM EDT ----- Please call to schedule HTN follow up, never rescheduled June appt.

## 2023-10-02 ENCOUNTER — Ambulatory Visit: Payer: Commercial Managed Care - PPO | Admitting: Family Medicine

## 2023-12-26 ENCOUNTER — Encounter: Payer: Self-pay | Admitting: Family Medicine

## 2024-02-13 ENCOUNTER — Other Ambulatory Visit: Payer: Self-pay | Admitting: Family Medicine

## 2024-02-13 DIAGNOSIS — K219 Gastro-esophageal reflux disease without esophagitis: Secondary | ICD-10-CM

## 2024-02-23 ENCOUNTER — Other Ambulatory Visit: Payer: Self-pay | Admitting: Family Medicine

## 2024-02-23 DIAGNOSIS — I10 Essential (primary) hypertension: Secondary | ICD-10-CM

## 2024-02-26 ENCOUNTER — Encounter: Payer: Commercial Managed Care - PPO | Admitting: Family Medicine

## 2024-03-03 ENCOUNTER — Other Ambulatory Visit

## 2024-03-03 ENCOUNTER — Other Ambulatory Visit: Payer: Self-pay | Admitting: *Deleted

## 2024-03-03 DIAGNOSIS — E785 Hyperlipidemia, unspecified: Secondary | ICD-10-CM

## 2024-03-03 DIAGNOSIS — R7309 Other abnormal glucose: Secondary | ICD-10-CM

## 2024-03-03 DIAGNOSIS — I10 Essential (primary) hypertension: Secondary | ICD-10-CM

## 2024-03-03 DIAGNOSIS — E663 Overweight: Secondary | ICD-10-CM

## 2024-03-04 ENCOUNTER — Other Ambulatory Visit

## 2024-03-09 ENCOUNTER — Other Ambulatory Visit

## 2024-03-09 DIAGNOSIS — E663 Overweight: Secondary | ICD-10-CM

## 2024-03-09 DIAGNOSIS — R7309 Other abnormal glucose: Secondary | ICD-10-CM

## 2024-03-09 DIAGNOSIS — E785 Hyperlipidemia, unspecified: Secondary | ICD-10-CM

## 2024-03-09 DIAGNOSIS — I10 Essential (primary) hypertension: Secondary | ICD-10-CM

## 2024-03-10 LAB — CBC WITH DIFFERENTIAL/PLATELET
Basophils Absolute: 0.1 10*3/uL (ref 0.0–0.2)
Basos: 1 %
EOS (ABSOLUTE): 0.2 10*3/uL (ref 0.0–0.4)
Eos: 3 %
Hematocrit: 39.9 % (ref 37.5–51.0)
Hemoglobin: 12.9 g/dL — ABNORMAL LOW (ref 13.0–17.7)
Immature Grans (Abs): 0 10*3/uL (ref 0.0–0.1)
Immature Granulocytes: 0 %
Lymphocytes Absolute: 1 10*3/uL (ref 0.7–3.1)
Lymphs: 17 %
MCH: 30.2 pg (ref 26.6–33.0)
MCHC: 32.3 g/dL (ref 31.5–35.7)
MCV: 93 fL (ref 79–97)
Monocytes Absolute: 0.7 10*3/uL (ref 0.1–0.9)
Monocytes: 12 %
Neutrophils Absolute: 3.6 10*3/uL (ref 1.4–7.0)
Neutrophils: 67 %
Platelets: 324 10*3/uL (ref 150–450)
RBC: 4.27 x10E6/uL (ref 4.14–5.80)
RDW: 14.4 % (ref 11.6–15.4)
WBC: 5.5 10*3/uL (ref 3.4–10.8)

## 2024-03-10 LAB — COMPREHENSIVE METABOLIC PANEL WITH GFR
ALT: 26 IU/L (ref 0–44)
AST: 26 IU/L (ref 0–40)
Albumin: 4.9 g/dL (ref 3.8–4.9)
Alkaline Phosphatase: 57 IU/L (ref 44–121)
BUN/Creatinine Ratio: 18 (ref 9–20)
BUN: 22 mg/dL (ref 6–24)
Bilirubin Total: 0.4 mg/dL (ref 0.0–1.2)
CO2: 22 mmol/L (ref 20–29)
Calcium: 10 mg/dL (ref 8.7–10.2)
Chloride: 102 mmol/L (ref 96–106)
Creatinine, Ser: 1.25 mg/dL (ref 0.76–1.27)
Globulin, Total: 2.5 g/dL (ref 1.5–4.5)
Glucose: 100 mg/dL — ABNORMAL HIGH (ref 70–99)
Potassium: 5.1 mmol/L (ref 3.5–5.2)
Sodium: 139 mmol/L (ref 134–144)
Total Protein: 7.4 g/dL (ref 6.0–8.5)
eGFR: 69 mL/min/{1.73_m2} (ref >59)

## 2024-03-10 LAB — TSH: TSH: 1.99 u[IU]/mL (ref 0.450–4.500)

## 2024-03-10 LAB — LIPID PANEL
Chol/HDL Ratio: 4.1 ratio (ref 0.0–5.0)
Cholesterol, Total: 226 mg/dL — ABNORMAL HIGH (ref 100–199)
HDL: 55 mg/dL (ref >39)
LDL Chol Calc (NIH): 158 mg/dL — ABNORMAL HIGH (ref 0–99)
Triglycerides: 75 mg/dL (ref 0–149)
VLDL Cholesterol Cal: 13 mg/dL (ref 5–40)

## 2024-03-10 LAB — HEMOGLOBIN A1C
Est. average glucose Bld gHb Est-mCnc: 111 mg/dL
Hgb A1c MFr Bld: 5.5 % (ref 4.8–5.6)

## 2024-03-11 ENCOUNTER — Ambulatory Visit (INDEPENDENT_AMBULATORY_CARE_PROVIDER_SITE_OTHER): Admitting: Family Medicine

## 2024-03-11 ENCOUNTER — Encounter: Payer: Self-pay | Admitting: Family Medicine

## 2024-03-11 VITALS — BP 131/82 | HR 89 | Ht 70.0 in | Wt 210.4 lb

## 2024-03-11 DIAGNOSIS — I10 Essential (primary) hypertension: Secondary | ICD-10-CM

## 2024-03-11 DIAGNOSIS — Z0000 Encounter for general adult medical examination without abnormal findings: Secondary | ICD-10-CM

## 2024-03-11 DIAGNOSIS — E663 Overweight: Secondary | ICD-10-CM | POA: Diagnosis not present

## 2024-03-11 NOTE — Progress Notes (Signed)
   Annual physical  Subjective   Patient ID: Randall White, male    DOB: 1971/06/12  Age: 53 y.o. MRN: [id number]  Chief Complaint  Patient presents with   Annual Exam   HPI Randall White is a 53 y.o. old male here  for annual exam.   Work:construction - road  Relationship:married.  Children:no Tobacco:dips Alcohol:regular - 2-3 day  Diet:no dietary restrictions.  Exercise:work  Subjective: - Ears ring all the time - Right ear feels like something in it, tickling all the time - Hearing loss - Fatigue, energy drops around 12:00 - Irregular eating patterns due to construction work, often doesn't eat until evening - In bed by 8:30-9:00 PM, wakes 4:45-5:00 AM - No reports of snoring or stopping breathing during sleep - No specific concerns other than ears and fatigue  Past Medical History: - HTN on daily medication - GERD on Protonix  - Takes daily allergy medication  - History of colonoscopy - Tobacco: Uses dip - Alcohol: 2-3 drinks after work - Occupation: Human resources officer - Exercise from work activities - No family hx of prostate or colon cancer   The 10-year ASCVD risk score (Arnett DK, et al., 2019) is: 11.9%  Health Maintenance Due  Topic Date Due   Pneumococcal Vaccine 53-16 Years old (1 of 2 - PCV) Never done   COVID-19 Vaccine (2 - 2024-25 season) 07/21/2023      Objective:     BP 131/82   Pulse 89   Ht 5\' 10"  (1.778 m)   Wt 210 lb 6.4 oz (95.4 kg)   SpO2 99%   BMI 30.19 kg/m    Physical Exam Gen: alert, oriented Heent: perrla, eomi Cv: rrr Pulm: lctab Gi: soft nbs Msk: normal gait Ext: no pedal edema.  Psych: pleasant   No results found for any visits on 03/11/24.      Assessment & Plan:   Physical exam, annual  Overweight (BMI 25.0-29.9) Assessment & Plan: Discussed improving eating habits, trying to ensure he eats 3 smaller meals a day to avoid overeating in the evening after not eating during the day.  This is Likely what  is Causing his fatigue as well .    Primary hypertension Assessment & Plan: Continue valsartan       Return in about 6 months (around 09/10/2024) for HTN, hld.    [name], MD

## 2024-03-11 NOTE — Patient Instructions (Signed)
 It was nice to see you today,  We addressed the following topics today: -Check your blood pressure at home twice a day for a few weeks and let us  know the results. - For your ear complaints, I would try using something like Flonase or Nasonex nasal spray every morning, 2 sprays each nostril, to help decrease inflammation of your eustachian tube - There are no great treatments for tinnitus or ringing in the ears, but if you would like to have your hearing tested at any point I can send in a referral to an audiologist. - If you would like to have your testosterone checked at any time because of your fatigue to schedule the appointment for a lab visit.  Otherwise I will see you back in 6 months for your blood pressure. - Try to incorporate some small meal even if it is only a few hundred calories in the morning so that you are not waiting until suppertime to eat your first meal. - Try to limit your saturated fat intake to less than 10 g/day.  This is found in red meat and dairy products.  Have a great day,  Etha Henle, MD

## 2024-03-14 NOTE — Assessment & Plan Note (Signed)
 Continue valsartan

## 2024-03-14 NOTE — Assessment & Plan Note (Signed)
 Discussed improving eating habits, trying to ensure he eats 3 smaller meals a day to avoid overeating in the evening after not eating during the day.  This is Likely what is Causing his fatigue as well .

## 2024-03-22 ENCOUNTER — Other Ambulatory Visit: Payer: Self-pay | Admitting: Family Medicine

## 2024-03-22 DIAGNOSIS — I10 Essential (primary) hypertension: Secondary | ICD-10-CM

## 2024-08-10 ENCOUNTER — Other Ambulatory Visit: Payer: Self-pay | Admitting: Family Medicine

## 2024-08-10 DIAGNOSIS — K219 Gastro-esophageal reflux disease without esophagitis: Secondary | ICD-10-CM

## 2024-09-10 ENCOUNTER — Ambulatory Visit: Admitting: Family Medicine

## 2024-09-21 ENCOUNTER — Other Ambulatory Visit: Payer: Self-pay | Admitting: Family Medicine

## 2024-09-21 DIAGNOSIS — I10 Essential (primary) hypertension: Secondary | ICD-10-CM

## 2024-10-19 ENCOUNTER — Ambulatory Visit: Admitting: Family Medicine
